# Patient Record
Sex: Female | Born: 1985 | Race: Black or African American | Hispanic: No | Marital: Married | Smoking: Former smoker
Health system: Southern US, Community
[De-identification: ages and names within clinical notes are randomized; demographics above are authoritative.]

## PROBLEM LIST (undated history)

## (undated) ENCOUNTER — Emergency Department (HOSPITAL_COMMUNITY): Discharge status: Absent without leave | Payer: Self-pay

## (undated) ENCOUNTER — Emergency Department (HOSPITAL_COMMUNITY): Discharge status: Absent without leave | Payer: No Typology Code available for payment source

## (undated) DIAGNOSIS — F329 Major depressive disorder, single episode, unspecified: Secondary | ICD-10-CM

## (undated) DIAGNOSIS — G43909 Migraine, unspecified, not intractable, without status migrainosus: Secondary | ICD-10-CM

## (undated) DIAGNOSIS — E559 Vitamin D deficiency, unspecified: Secondary | ICD-10-CM

## (undated) DIAGNOSIS — T7840XA Allergy, unspecified, initial encounter: Secondary | ICD-10-CM

## (undated) DIAGNOSIS — I10 Essential (primary) hypertension: Secondary | ICD-10-CM

## (undated) DIAGNOSIS — K115 Sialolithiasis: Secondary | ICD-10-CM

## (undated) DIAGNOSIS — F419 Anxiety disorder, unspecified: Secondary | ICD-10-CM

## (undated) DIAGNOSIS — F32A Depression, unspecified: Secondary | ICD-10-CM

## (undated) HISTORY — DX: Vitamin D deficiency, unspecified: E55.9

## (undated) HISTORY — DX: Migraine, unspecified, not intractable, without status migrainosus: G43.909

## (undated) HISTORY — PX: NO PAST SURGERIES: SHX2092

## (undated) HISTORY — DX: Essential (primary) hypertension: I10

## (undated) HISTORY — DX: Allergy, unspecified, initial encounter: T78.40XA

---

## 2006-05-26 ENCOUNTER — Ambulatory Visit (HOSPITAL_COMMUNITY): Admission: RE | Admit: 2006-05-26 | Discharge: 2006-05-26 | Payer: Self-pay | Admitting: Emergency Medicine

## 2006-10-03 ENCOUNTER — Emergency Department (HOSPITAL_COMMUNITY): Admission: EM | Admit: 2006-10-03 | Discharge: 2006-10-03 | Payer: Self-pay | Admitting: Emergency Medicine

## 2007-02-14 ENCOUNTER — Emergency Department (HOSPITAL_COMMUNITY): Admission: EM | Admit: 2007-02-14 | Discharge: 2007-02-14 | Payer: Self-pay | Admitting: Emergency Medicine

## 2007-03-24 ENCOUNTER — Encounter: Payer: Self-pay | Admitting: Obstetrics and Gynecology

## 2007-03-24 ENCOUNTER — Ambulatory Visit: Payer: Self-pay | Admitting: Obstetrics and Gynecology

## 2007-03-25 ENCOUNTER — Ambulatory Visit (HOSPITAL_COMMUNITY): Admission: RE | Admit: 2007-03-25 | Discharge: 2007-03-25 | Payer: Self-pay | Admitting: Family Medicine

## 2007-04-03 ENCOUNTER — Emergency Department (HOSPITAL_COMMUNITY): Admission: EM | Admit: 2007-04-03 | Discharge: 2007-04-03 | Payer: Self-pay | Admitting: Emergency Medicine

## 2008-03-15 ENCOUNTER — Emergency Department (HOSPITAL_COMMUNITY): Admission: EM | Admit: 2008-03-15 | Discharge: 2008-03-16 | Payer: Self-pay | Admitting: Emergency Medicine

## 2008-06-27 ENCOUNTER — Emergency Department (HOSPITAL_COMMUNITY): Admission: EM | Admit: 2008-06-27 | Discharge: 2008-06-27 | Payer: Self-pay | Admitting: Emergency Medicine

## 2008-08-30 IMAGING — US US SOFT TISSUE HEAD/NECK
1 series · 14 of 25 positions shown · non-contrast
Comparison: none

CLINICAL DATA: Thyroid nodules.  
 THYROID ULTRASOUND:
TECHNIQUE: Ultrasound examination of the thyroid gland and adjacent soft tissue structures was performed.

[Series 1: us soft tissue head/neck · 0.07mm/px · 14 of 38 slices shown]
[im 1/38]
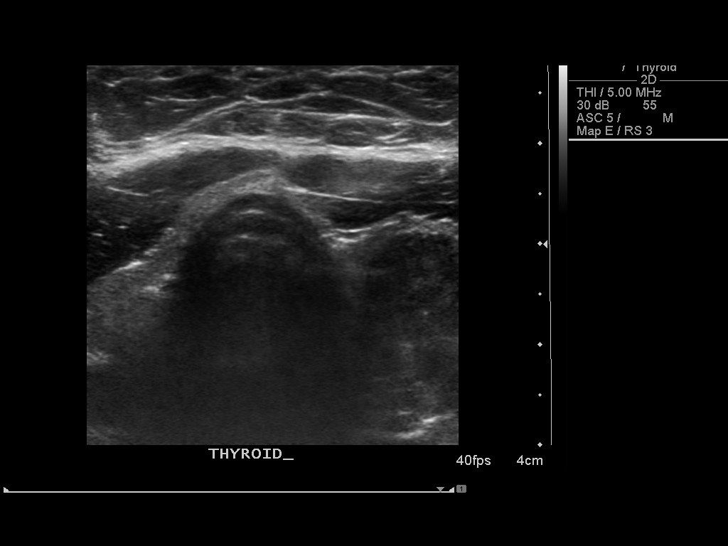
[im 4/38]
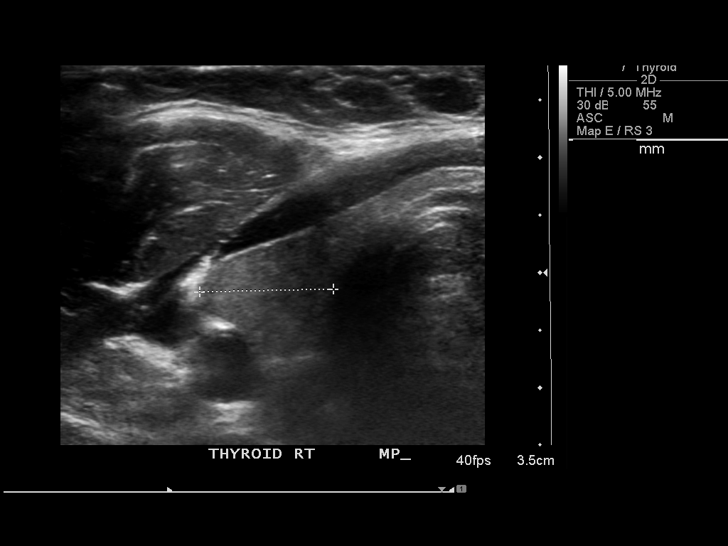
[im 7/38]
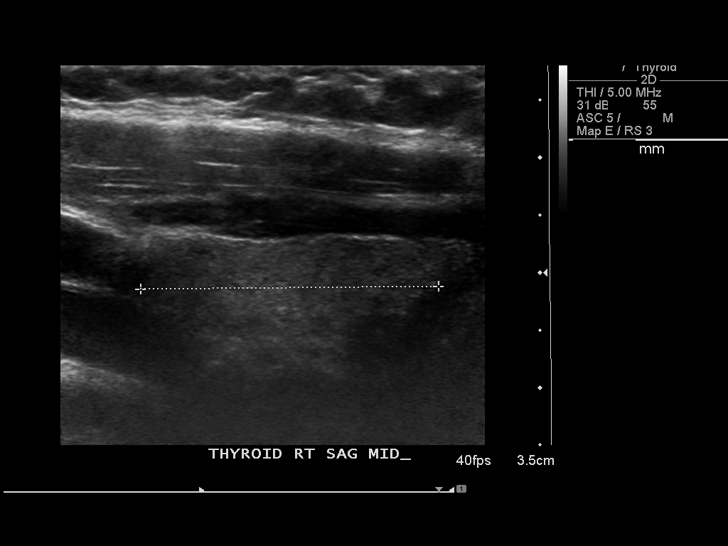
[im 10/38]
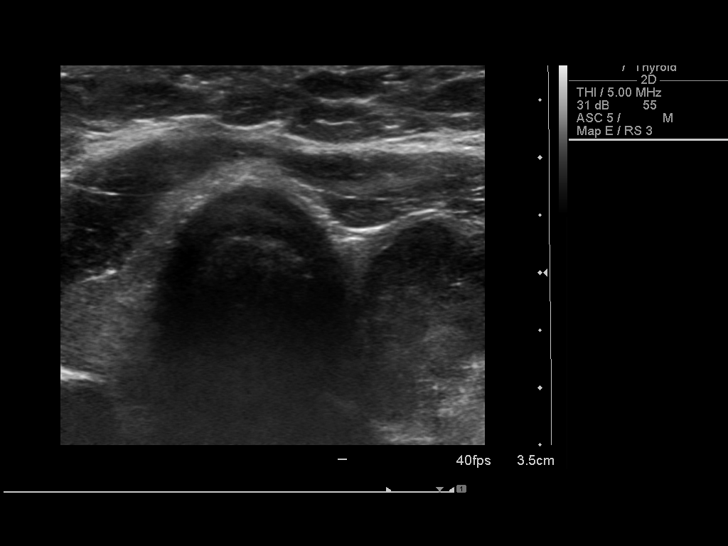
[im 13/38]
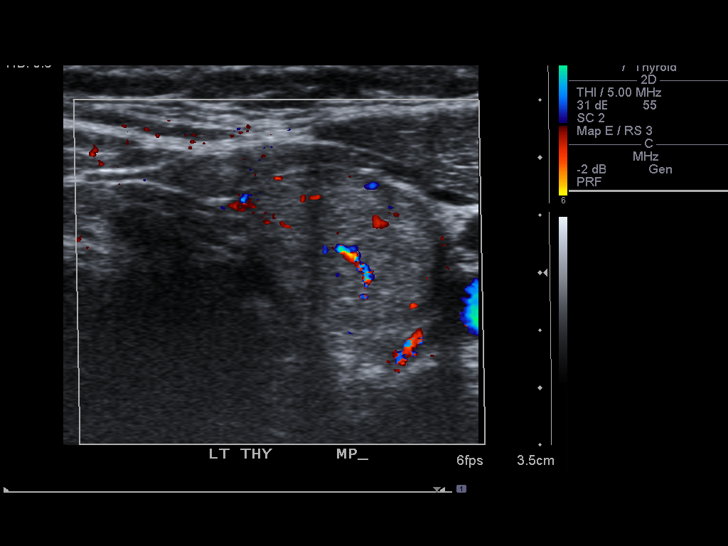
[im 14/38]
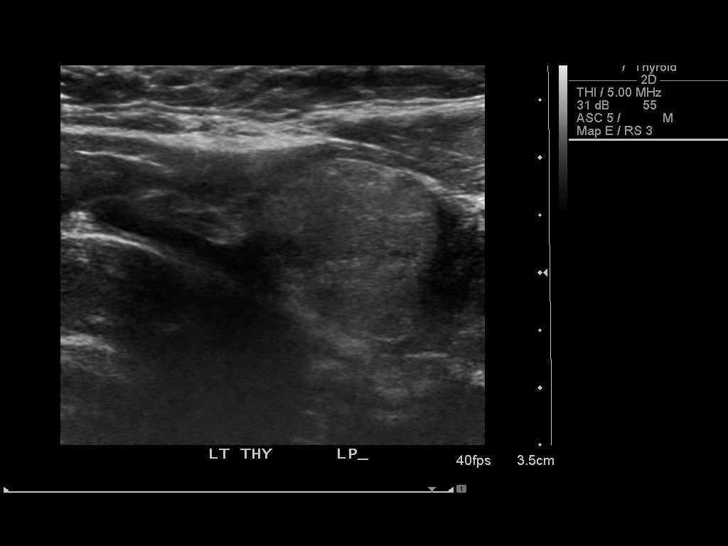
[im 17/38]
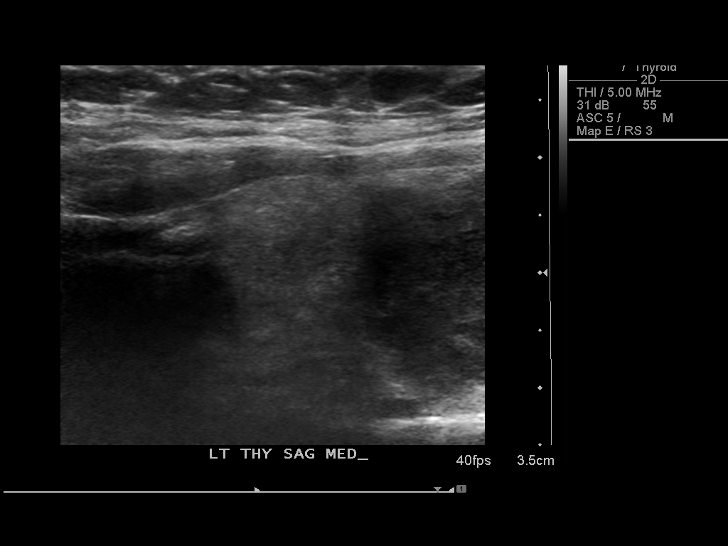
[im 21/38]
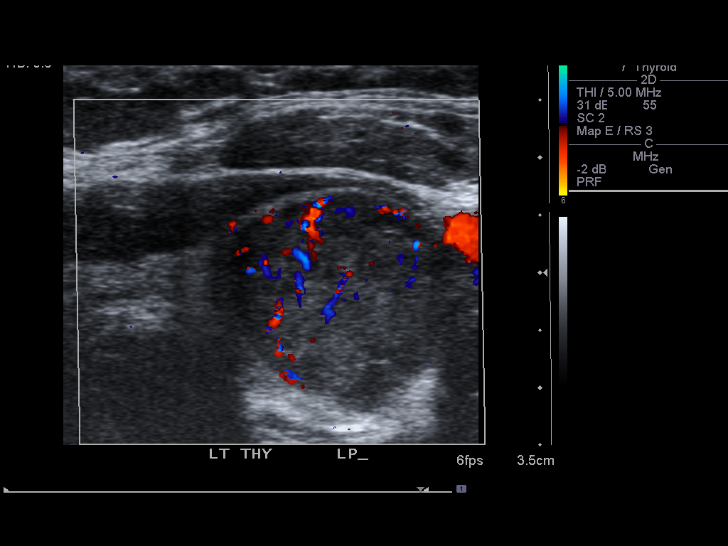
[im 24/38]
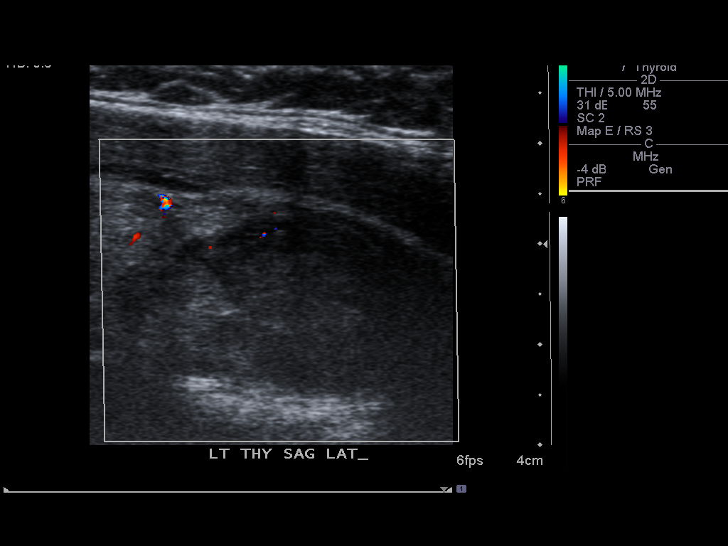
[im 25/38]
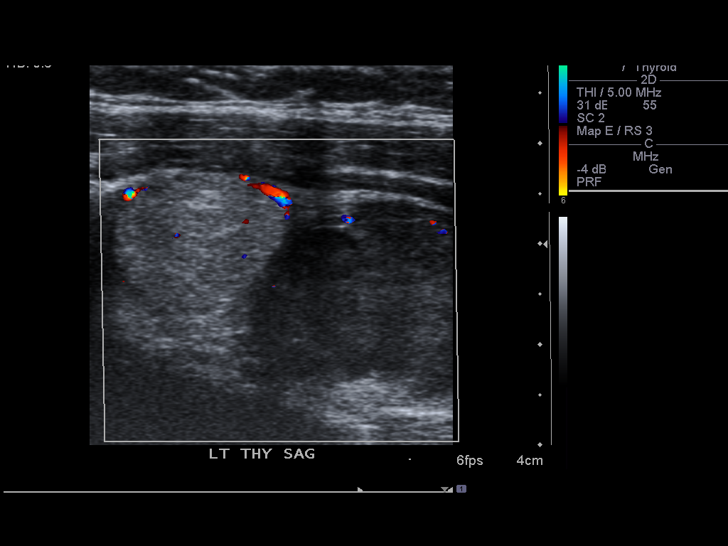
[im 28/38]
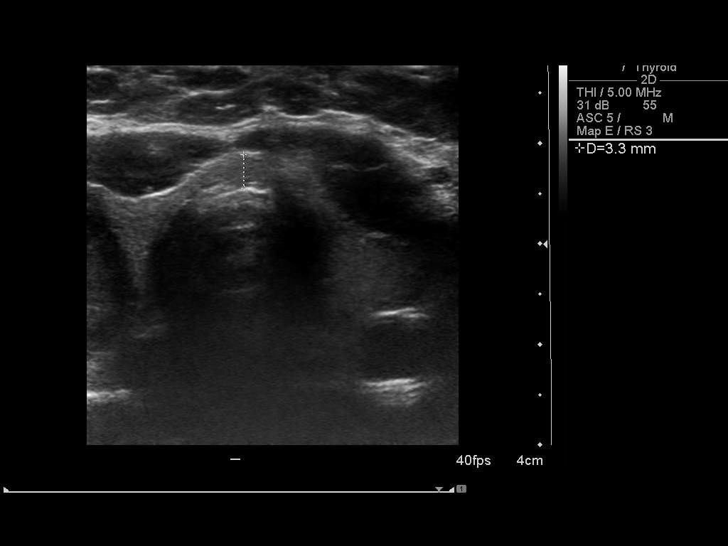
[im 31/38]
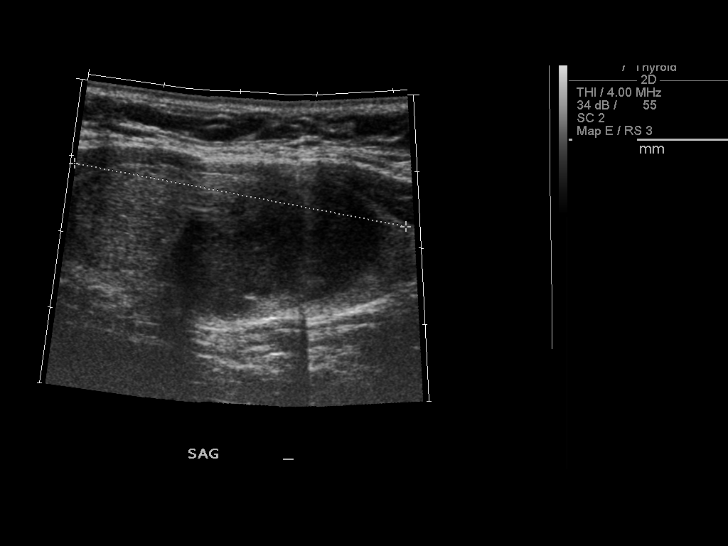
[im 34/38]
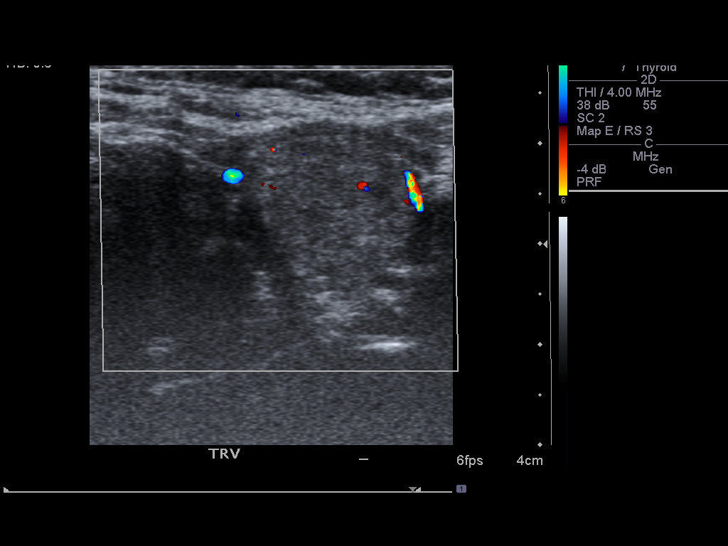
[im 38/38]
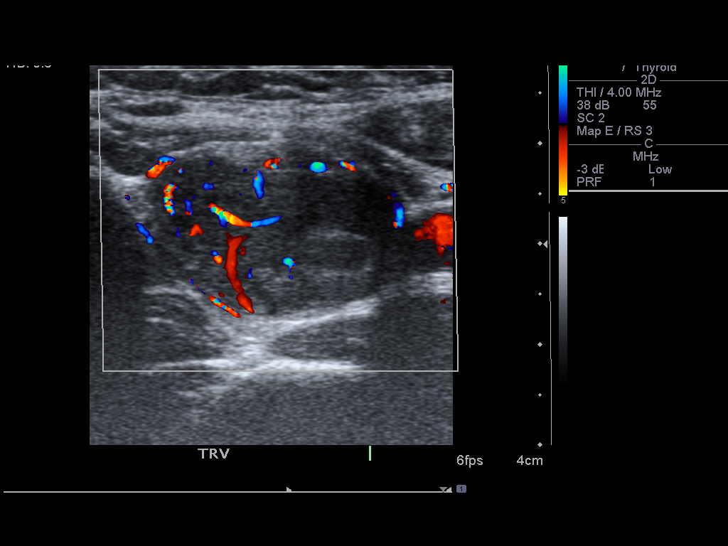

[14 of 25 positions shown; findings below may reference images not displayed]

FINDINGS: The right thyroid lobe is normal in size measuring approximately 2.6 cm in length x 1.0 cm in AP diameter x 1.2 cm in transverse diameter.  No thyroid nodules or masses are identified within the right lobe.  The thyroid isthmus is also normal in appearance.
 The left thyroid lobe is larger than the right measuring approximately 4 cm in length x 2 cm in AP diameter x 2.5 cm in transverse diameter.  A solid isoechoic nodule is seen within the upper left thyroid lobe which measures 1.7 x 1.3 x 1.7 cm.  A larger hypoechoic solid mass is seen in the lower left thyroid lobe which measures 3.0 x 1.8 x 2.3 cm.  No microcalcifications are noted.
IMPRESSION: 1.  Two solid nodules in the left thyroid lobe, largest in the lower pole measuring 3 cm.  Needle aspiration biopsy is recommended.
 2.  Normal right thyroid lobe.

## 2008-11-21 ENCOUNTER — Other Ambulatory Visit: Payer: Self-pay | Admitting: Emergency Medicine

## 2008-11-21 ENCOUNTER — Observation Stay (HOSPITAL_COMMUNITY): Admission: AD | Admit: 2008-11-21 | Discharge: 2008-11-21 | Payer: Self-pay | Admitting: Obstetrics and Gynecology

## 2008-12-03 ENCOUNTER — Encounter: Payer: Self-pay | Admitting: Physician Assistant

## 2008-12-03 ENCOUNTER — Ambulatory Visit: Payer: Self-pay | Admitting: Internal Medicine

## 2008-12-03 DIAGNOSIS — N949 Unspecified condition associated with female genital organs and menstrual cycle: Secondary | ICD-10-CM

## 2008-12-03 DIAGNOSIS — N938 Other specified abnormal uterine and vaginal bleeding: Secondary | ICD-10-CM

## 2008-12-03 DIAGNOSIS — Z8639 Personal history of other endocrine, nutritional and metabolic disease: Secondary | ICD-10-CM

## 2008-12-03 DIAGNOSIS — Z862 Personal history of diseases of the blood and blood-forming organs and certain disorders involving the immune mechanism: Secondary | ICD-10-CM

## 2008-12-03 DIAGNOSIS — E041 Nontoxic single thyroid nodule: Secondary | ICD-10-CM | POA: Insufficient documentation

## 2008-12-03 HISTORY — DX: Other specified abnormal uterine and vaginal bleeding: N93.8

## 2008-12-06 ENCOUNTER — Inpatient Hospital Stay (HOSPITAL_COMMUNITY): Admission: AD | Admit: 2008-12-06 | Discharge: 2008-12-06 | Payer: Self-pay | Admitting: Obstetrics & Gynecology

## 2008-12-11 ENCOUNTER — Encounter: Payer: Self-pay | Admitting: Physician Assistant

## 2008-12-11 LAB — CONVERTED CEMR LAB
ALT: <8 units/L (ref 0–35)
CO2: 20 meq/L (ref 19–32)
Calcium: 9.3 mg/dL (ref 8.4–10.5)
Chloride: 106 meq/L (ref 96–112)
Creatinine, Ser: 0.75 mg/dL (ref 0.40–1.20)
Eosinophils Absolute: 0.2 10*3/uL (ref 0.0–0.7)
HCT: 36.3 % (ref 36.0–46.0)
Lymphocytes Relative: 25 % (ref 12–46)
Lymphs Abs: 1.8 10*3/uL (ref 0.7–4.0)
MCHC: 27.3 g/dL — ABNORMAL LOW (ref 30.0–36.0)
Monocytes Relative: 6 % (ref 3–12)
Neutro Abs: 4.9 10*3/uL (ref 1.7–7.7)
Platelets: 458 10*3/uL — ABNORMAL HIGH (ref 150–400)
RDW: 28.1 % — ABNORMAL HIGH (ref 11.5–15.5)
Sodium: 141 meq/L (ref 135–145)
TSH: 2.079 microintl units/mL (ref 0.350–4.500)
Total Protein: 7.6 g/dL (ref 6.0–8.3)

## 2009-01-07 ENCOUNTER — Telehealth: Payer: Self-pay | Admitting: Physician Assistant

## 2009-02-22 ENCOUNTER — Telehealth: Payer: Self-pay | Admitting: Physician Assistant

## 2009-02-25 ENCOUNTER — Encounter (INDEPENDENT_AMBULATORY_CARE_PROVIDER_SITE_OTHER): Payer: Self-pay | Admitting: *Deleted

## 2009-03-11 ENCOUNTER — Emergency Department (HOSPITAL_COMMUNITY): Admission: EM | Admit: 2009-03-11 | Discharge: 2009-03-11 | Payer: Self-pay | Admitting: Emergency Medicine

## 2009-12-26 ENCOUNTER — Emergency Department (HOSPITAL_COMMUNITY): Admission: EM | Admit: 2009-12-26 | Discharge: 2009-12-26 | Payer: Self-pay | Admitting: Emergency Medicine

## 2010-02-10 ENCOUNTER — Emergency Department (HOSPITAL_COMMUNITY)
Admission: EM | Admit: 2010-02-10 | Discharge: 2010-02-10 | Payer: Self-pay | Source: Home / Self Care | Admitting: Emergency Medicine

## 2010-02-23 ENCOUNTER — Encounter: Payer: Self-pay | Admitting: Internal Medicine

## 2010-02-23 ENCOUNTER — Encounter: Payer: Self-pay | Admitting: *Deleted

## 2010-03-06 NOTE — Progress Notes (Signed)
  Phone Note Outgoing Call   Summary of Call: Pt still needs thyroid u/s. Initial call taken by: Brynda Rim,  February 22, 2009 2:26 PM  Follow-up for Phone Call        Left message on answering machine for pt to call back...pt needs appt time for ultrasound.Marland KitchenMarland KitchenMarland KitchenArmenia Shannon  February 22, 2009 3:10 PM   number is disconnected. ... willl mail letter..... [place] Shannon  February 25, 2009 12:07 PM

## 2010-03-06 NOTE — Letter (Signed)
Summary: *HSN Results Follow up  [facility]  [address] [place], [place] [address]   Phone: [phone]  Fax: [phone]      02/25/2009   Anne Shaffer [address] [place], [place]  [address]   Dear  Ms. Tashika MCCOLLUM,                            ____S.Drinkard,FNP   ____D. Gore,FNP       ____B. McPherson,MD   ____V. Rankins,MD    ____E. Mulberry,MD    ____N. Daphine Deutscher, FNP  ____D. Reche Dixon, MD    ____K. Philipp Deputy, MD    ____Other     This letter is to inform you that your recent test(s):  _______Pap Smear    _______Lab Test     _______X-ray    _______ is within acceptable limits  _______ requires a medication change  _______ requires a follow-up lab visit  _______ requires a follow-up visit with your provider   Comments:  We have been trying to reach you to let you know we rescheduled your appointment to recieve ultrasound.  Please give the office a call at your earliest convenience.       _________________________________________________________ If you have any questions, please contact our office                     Sincerely,  [place] Shannon [facility]

## 2010-03-06 NOTE — Letter (Signed)
Summary: TEST ORDER FORM//ULTRASOUND  TEST ORDER FORM//ULTRASOUND   Imported By: Arta Bruce 04/19/2009 12:22:47  _____________________________________________________________________  External Attachment:    Type:   Image     Comment:   External Document

## 2010-05-07 LAB — URINALYSIS, ROUTINE W REFLEX MICROSCOPIC
Bilirubin Urine: NEGATIVE
Specific Gravity, Urine: 1.01 (ref 1.005–1.030)
pH: 6 (ref 5.0–8.0)

## 2010-05-07 LAB — GC/CHLAMYDIA PROBE AMP, GENITAL

## 2010-05-07 LAB — CBC
MCHC: 29.9 g/dL — ABNORMAL LOW (ref 30.0–36.0)
Platelets: 503 10*3/uL — ABNORMAL HIGH (ref 150–400)
RDW: 31.8 % — ABNORMAL HIGH (ref 11.5–15.5)

## 2010-05-07 LAB — URINE MICROSCOPIC-ADD ON

## 2010-05-07 LAB — POCT PREGNANCY, URINE

## 2010-05-08 LAB — URINALYSIS, ROUTINE W REFLEX MICROSCOPIC
Bilirubin Urine: NEGATIVE
Nitrite: NEGATIVE
Protein, ur: NEGATIVE mg/dL
Specific Gravity, Urine: 1.017 (ref 1.005–1.030)
Urobilinogen, UA: 1 mg/dL (ref 0.0–1.0)

## 2010-05-08 LAB — CROSSMATCH
ABO/RH(D): A POS
Antibody Screen: NEGATIVE

## 2010-05-08 LAB — CBC
HCT: 22.5 % — ABNORMAL LOW (ref 36.0–46.0)
HCT: 24.5 % — ABNORMAL LOW (ref 36.0–46.0)
Hemoglobin: 6.4 g/dL — CL (ref 12.0–15.0)
Hemoglobin: 7.1 g/dL — CL (ref 12.0–15.0)
MCHC: 29 g/dL — ABNORMAL LOW (ref 30.0–36.0)
MCV: 58.9 fL — ABNORMAL LOW (ref 78.0–100.0)
MCV: 61.8 fL — ABNORMAL LOW (ref 78.0–100.0)
Platelets: 232 10*3/uL (ref 150–400)
RBC: 3.98 MIL/uL (ref 3.87–5.11)
WBC: 5.3 10*3/uL (ref 4.0–10.5)
WBC: 4.7 10*3/uL (ref 4.0–10.5)

## 2010-05-08 LAB — T3: T3, Total: 113.2 ng/dl (ref 80.0–204.0)

## 2010-05-08 LAB — POCT I-STAT, CHEM 8
BUN: 8 mg/dL (ref 6–23)
Chloride: 106 mEq/L (ref 96–112)
Creatinine, Ser: 0.4 mg/dL (ref 0.4–1.2)
Potassium: 3.6 mEq/L (ref 3.5–5.1)
Sodium: 141 mEq/L (ref 135–145)

## 2010-05-08 LAB — URINE MICROSCOPIC-ADD ON

## 2010-05-08 LAB — ABO/RH: ABO/RH(D): A POS

## 2010-05-08 LAB — PROTIME-INR: INR: 1.05 (ref 0.00–1.49)

## 2010-05-08 LAB — APTT: aPTT: 27 seconds (ref 24–37)

## 2010-05-13 LAB — URINALYSIS, ROUTINE W REFLEX MICROSCOPIC
Glucose, UA: NEGATIVE mg/dL
Hgb urine dipstick: NEGATIVE
Ketones, ur: NEGATIVE mg/dL
Protein, ur: NEGATIVE mg/dL
pH: 6 (ref 5.0–8.0)

## 2010-06-17 NOTE — Group Therapy Note (Signed)
NAME:  Anne Shaffer, Anne Shaffer NO.:  [id number]   MEDICAL RECORD NO.:  [id number]          PATIENT TYPE:  WOC   LOCATION:  [facility]                   FACILITY:  [facility]   PHYSICIAN:  Argentina Donovan, MD        DATE OF BIRTH:  28-Jul-1985   DATE OF SERVICE:  03/24/2007                                  CLINIC NOTE   The patient 25 year old African American female gravida 1, para 0-0-1-0  who had a spontaneous miscarriage sometime last summer and since that  time has had somewhat irregular periods.  She had apparently severe  throat infection back in the spring of last year and throat swollen  enough they sent her for a CT which showed apparently homogeneous  enlargement of the left lobe of the thyroid.  She has complained of some  fatigue and also said she is anemic.  Will be following that up.  On the  other hand she shows some sign of hirsutism with this a marked acne and  hair growth on her lower chin.  Subumbilical area does not seem to be  manifesting this problem.  And her main complaint here was to be  irregular periods with bleeding on and off sometimes heavy sometimes  light and this apparently has been going on since the early part of a  fall last year.  She was in to the MAU.  She said she ran a couple  pregnancy tests at home apparently and will one was positive, one was  negative, and she was never followed up again.  She has not had a Pap  smear in she does not know how long.   EXAMINATION:  External genitalia is normal.  BUS within normal limits.  Vagina is clean and well rugated.  Cervix is clean nulliparous.  Pap  smear was taken.  Uterus and adnexa were not palpated because of the  habitus of the patient 253 pounds and 5 feet 5 inches tall.  ABDOMEN:  Soft, flat and not particularly tender.   The plan is to follow up as was requested after the CT of her thyroid  with a non-emergent ultrasound.  We are going to comply with that as  well as get a pelvic  ultrasound to evaluate the uterus and ovaries and a  CBC and thyroid profile because of fatigue and the fact that she had an  enlarged gland on one side apparently with her CT scan.   IMPRESSION:  Amenorrhea.  Mild hirsutism.  Morbid obesity.  Probable  polycystic ovarian syndrome.  Will see her back in 2 weeks to review the  findings on and decide on therapy for this patient           ______________________________  Argentina Donovan, MD     PR/MEDQ  D:  03/24/2007  T:  03/25/2007  Job:  [id number]

## 2010-06-25 ENCOUNTER — Inpatient Hospital Stay (HOSPITAL_COMMUNITY)
Admission: AD | Admit: 2010-06-25 | Discharge: 2010-06-25 | Disposition: A | Discharge status: Home / Self Care | Payer: BC Managed Care – PPO | Source: Ambulatory Visit | Attending: Obstetrics & Gynecology | Admitting: Obstetrics & Gynecology

## 2010-06-25 DIAGNOSIS — F411 Generalized anxiety disorder: Secondary | ICD-10-CM | POA: Insufficient documentation

## 2010-06-25 DIAGNOSIS — R0602 Shortness of breath: Secondary | ICD-10-CM

## 2010-06-25 LAB — POCT PREGNANCY, URINE: Preg Test, Ur: NEGATIVE

## 2010-06-26 ENCOUNTER — Encounter: Payer: Self-pay | Admitting: *Deleted

## 2010-07-26 ENCOUNTER — Emergency Department (HOSPITAL_COMMUNITY)
Admission: EM | Admit: 2010-07-26 | Discharge: 2010-07-26 | Disposition: A | Discharge status: Home / Self Care | Payer: BC Managed Care – PPO | Attending: Emergency Medicine | Admitting: Emergency Medicine

## 2010-07-26 DIAGNOSIS — H9209 Otalgia, unspecified ear: Secondary | ICD-10-CM | POA: Insufficient documentation

## 2010-07-26 DIAGNOSIS — H6120 Impacted cerumen, unspecified ear: Secondary | ICD-10-CM | POA: Insufficient documentation

## 2010-11-14 LAB — CBC
Hemoglobin: 10.4 — ABNORMAL LOW
MCHC: 31.9
MCV: 76 — ABNORMAL LOW
RBC: 4.29
WBC: 7

## 2010-11-14 LAB — DIFFERENTIAL
Lymphs Abs: 1.4
Monocytes Absolute: 0.4
Monocytes Relative: 6
Neutro Abs: 5.1
Neutrophils Relative %: 73

## 2010-11-14 LAB — POCT PREGNANCY, URINE
Operator id: 151321
Preg Test, Ur: NEGATIVE

## 2010-12-30 ENCOUNTER — Emergency Department (HOSPITAL_COMMUNITY)
Admission: EM | Admit: 2010-12-30 | Discharge: 2010-12-30 | Disposition: A | Discharge status: Home Health | Payer: BC Managed Care – PPO

## 2010-12-30 NOTE — ED Notes (Signed)
Pt has decided to leave and and be been at a urgent care tomorrow morning. AMA papers signed

## 2011-03-16 ENCOUNTER — Encounter (HOSPITAL_COMMUNITY): Payer: Self-pay | Admitting: *Deleted

## 2011-03-16 ENCOUNTER — Inpatient Hospital Stay (HOSPITAL_COMMUNITY)
Admission: AD | Admit: 2011-03-16 | Discharge: 2011-03-17 | Disposition: A | Discharge status: Home / Self Care | Payer: BC Managed Care – PPO | Source: Ambulatory Visit | Attending: Obstetrics & Gynecology | Admitting: Obstetrics & Gynecology

## 2011-03-16 DIAGNOSIS — Z3202 Encounter for pregnancy test, result negative: Secondary | ICD-10-CM | POA: Insufficient documentation

## 2011-03-16 DIAGNOSIS — R112 Nausea with vomiting, unspecified: Secondary | ICD-10-CM | POA: Insufficient documentation

## 2011-03-16 HISTORY — DX: Anxiety disorder, unspecified: F41.9

## 2011-03-16 HISTORY — DX: Depression, unspecified: F32.A

## 2011-03-16 HISTORY — DX: Major depressive disorder, single episode, unspecified: F32.9

## 2011-03-16 LAB — URINALYSIS, ROUTINE W REFLEX MICROSCOPIC
Leukocytes, UA: NEGATIVE
Nitrite: NEGATIVE
Specific Gravity, Urine: <1.005 — ABNORMAL LOW (ref 1.005–1.030)
Urobilinogen, UA: 0.2 mg/dL (ref 0.0–1.0)
pH: 6.5 (ref 5.0–8.0)

## 2011-03-16 LAB — POCT PREGNANCY, URINE: Preg Test, Ur: NEGATIVE

## 2011-03-16 MED ORDER — PROMETHAZINE HCL 25 MG PO TABS: 25.0000 mg | ORAL_TABLET | Freq: Four times a day (QID) | ORAL | Status: AC | PRN

## 2011-03-16 NOTE — Progress Notes (Signed)
Pt c/o nasuea x3 weeks. Lower abdominal pain x1 week. No vaginal or discharge.

## 2011-03-16 NOTE — Discharge Instructions (Signed)
B.R.A.T. Diet    Your doctor has recommended the B.R.A.T. diet for you or your child until the condition improves. This is often used to help control diarrhea and vomiting symptoms. If you or your child can tolerate clear liquids, you may have:  · Bananas.   · Anne Shaffer.   · Applesauce.   · Toast (and other simple starches such as crackers, potatoes, noodles).   Be sure to avoid dairy products, meats, and fatty foods until symptoms are better. Fruit juices such as apple, grape, and prune juice can make diarrhea worse. Avoid these. Continue this diet for 2 days or as instructed by your caregiver.  Document Released: 01/19/2005 Document Revised: 10/01/2010 Document Reviewed: 07/08/2006  ExitCare® Patient Information ©2012 [employer].

## 2011-03-16 NOTE — ED Provider Notes (Signed)
History   Pt presents today c/o nausea with some vomiting over the past 3 wks. She states she mainly just wants a preg test. She denies abd pain and states she does not want a pelvic exam.   Chief Complaint  Patient presents with  . Nausea   HPI  OB History    Grav Para Term Preterm Abortions TAB SAB Ect Mult Living   1    1  1          Past Medical History  Diagnosis Date  . Anxiety   . Depression     Past Surgical History  Procedure Date  . No past surgeries     No family history on file.  History  Substance Use Topics  . Smoking status: Not on file  . Smokeless tobacco: Not on file  . Alcohol Use: No    Allergies:  Allergies  Allergen Reactions  . Shellfish Allergy     Prescriptions prior to admission  Medication Sig Dispense Refill  . aspirin-sod bicarb-citric acid (ALKA-SELTZER) 325 MG TBEF Take 325 mg by mouth as needed.      . fexofenadine (ALLEGRA) 30 MG tablet Take 30 mg by mouth as needed.        Review of Systems  Constitutional: Negative for fever and chills.  Eyes: Negative for blurred vision and double vision.  Respiratory: Negative for cough, hemoptysis, sputum production, shortness of breath and wheezing.   Cardiovascular: Negative for chest pain and palpitations.  Gastrointestinal: Positive for nausea and vomiting. Negative for abdominal pain, diarrhea and constipation.  Genitourinary: Negative for dysuria, urgency, frequency and hematuria.  Neurological: Negative for dizziness and headaches.  Psychiatric/Behavioral: Negative for depression and suicidal ideas.   Physical Exam   Blood pressure 115/91, pulse 100, temperature 98 F (36.7 C), temperature source Oral, resp. rate 20, height 5\' 6"  (1.676 m), weight 293 lb (132.904 kg), last menstrual period 02/17/2011.  Physical Exam  Nursing note and vitals reviewed. Constitutional: She is oriented to person, place, and time. She appears well-developed and well-nourished. No distress.  HENT:    Head: Normocephalic and atraumatic.  Eyes: EOM are normal. Pupils are equal, round, and reactive to light.  GI: Soft. She exhibits no distension. There is no tenderness. There is no rebound and no guarding.  Neurological: She is alert and oriented to person, place, and time.  Skin: Skin is warm and dry. She is not diaphoretic.  Psychiatric: She has a normal mood and affect. Her behavior is normal. Judgment and thought content normal.    MAU Course  Procedures  Results for orders placed during the hospital encounter of 03/16/11 (from the past 24 hour(s))  URINALYSIS, ROUTINE W REFLEX MICROSCOPIC     Status: Abnormal   Collection Time   03/16/11 10:00 PM      Component Value Range   Color, Urine STRAW (*) YELLOW    APPearance CLEAR  CLEAR    Specific Gravity, Urine <1.005 (*) 1.005 - 1.030    pH 6.5  5.0 - 8.0    Glucose, UA NEGATIVE  NEGATIVE (mg/dL)   Hgb urine dipstick NEGATIVE  NEGATIVE    Bilirubin Urine NEGATIVE  NEGATIVE    Ketones, ur NEGATIVE  NEGATIVE (mg/dL)   Protein, ur NEGATIVE  NEGATIVE (mg/dL)   Urobilinogen, UA 0.2  0.0 - 1.0 (mg/dL)   Nitrite NEGATIVE  NEGATIVE    Leukocytes, UA NEGATIVE  NEGATIVE   POCT PREGNANCY, URINE     Status: Normal  Collection Time   03/16/11 10:12 PM      Component Value Range   Preg Test, Ur NEGATIVE  NEGATIVE      Assessment and Plan  N&V: discussed with pt at length. Will give Rx for phenergan. She will f/u with her PCP. Discussed diet, activity, risks, and precautions.  Clinton Gallant. Avacyn Kloosterman III, DrHSc, MPAS, PA-C  03/16/2011, 11:46 PM   Henrietta Hoover, PA 03/16/11 2349

## 2012-06-03 ENCOUNTER — Encounter (HOSPITAL_COMMUNITY): Payer: Self-pay | Admitting: Emergency Medicine

## 2012-06-03 ENCOUNTER — Emergency Department (HOSPITAL_COMMUNITY)
Admission: EM | Admit: 2012-06-03 | Discharge: 2012-06-03 | Disposition: A | Discharge status: Home / Self Care | Payer: BC Managed Care – PPO | Attending: Emergency Medicine | Admitting: Emergency Medicine

## 2012-06-03 DIAGNOSIS — R220 Localized swelling, mass and lump, head: Secondary | ICD-10-CM

## 2012-06-03 DIAGNOSIS — K029 Dental caries, unspecified: Secondary | ICD-10-CM | POA: Insufficient documentation

## 2012-06-03 DIAGNOSIS — Z8659 Personal history of other mental and behavioral disorders: Secondary | ICD-10-CM | POA: Insufficient documentation

## 2012-06-03 MED ORDER — PENICILLIN V POTASSIUM 500 MG PO TABS: 500.0000 mg | ORAL_TABLET | Freq: Four times a day (QID) | ORAL | Status: AC

## 2012-06-03 MED ORDER — OXYCODONE-ACETAMINOPHEN 5-325 MG PO TABS
2.0000 | ORAL_TABLET | Freq: Once | ORAL | Status: AC
  Administered 2012-06-03: 2 via ORAL
  Filled 2012-06-03: qty 2

## 2012-06-03 MED ORDER — OXYCODONE-ACETAMINOPHEN 5-325 MG PO TABS: 1.0000 | ORAL_TABLET | Freq: Four times a day (QID) | ORAL | Status: DC | PRN

## 2012-06-03 NOTE — ED Provider Notes (Signed)
History     CSN: [id number]  Arrival date & time 06/03/12  0102   None     Chief Complaint  Patient presents with  . Jaw Pain    (Consider location/radiation/quality/duration/timing/severity/associated sxs/prior treatment) HPI Comments: Patient is a 27 year old female who presents for left-sided facial swelling x2 days. Patient states the swelling is accompanied by a throbbing pain sensation that is waxing and waning in severity, intermittent, and nonradiating. Patient denies any aggravating factors however states that [facility] powder alleviates her discomfort. Patient denies fevers, ear pain or discharge, eye pain, drooling, sore throat or difficulty swallowing, cough, and shortness of breath.   The history is provided by the patient. No language interpreter was used.    Past Medical History  Diagnosis Date  . Anxiety   . Depression     Past Surgical History  Procedure Laterality Date  . No past surgeries      No family history on file.  History  Substance Use Topics  . Smoking status: Not on file  . Smokeless tobacco: Not on file  . Alcohol Use: No    OB History   Grav Para Term Preterm Abortions TAB SAB Ect Mult Living   1    1  1          Review of Systems  Constitutional: Negative for fever.  HENT: Positive for facial swelling and dental problem.   Skin: Negative for color change.  Neurological: Negative for numbness.  All other systems reviewed and are negative.    Allergies  Shellfish allergy  Home Medications   Current Outpatient Rx  Name  Route  Sig  Dispense  Refill  . oxyCODONE-acetaminophen (PERCOCET/ROXICET) 5-325 MG per tablet   Oral   Take 1 tablet by mouth every 6 (six) hours as needed for pain.   6 tablet   0   . penicillin v potassium (VEETID) 500 MG tablet   Oral   Take 1 tablet (500 mg total) by mouth 4 (four) times daily.   40 tablet   0     BP 139/94  Pulse 100  Temp(Src) 97.9 F (36.6 C) (Oral)  Resp 14  SpO2 98%  LMP  05/02/2012  Physical Exam  Nursing note and vitals reviewed. Constitutional: She is oriented to person, place, and time. She appears well-developed and well-nourished. No distress.  HENT:  Head: Normocephalic and atraumatic. No trismus in the jaw.  Right Ear: Tympanic membrane, external ear and ear canal normal. No mastoid tenderness.  Left Ear: Tympanic membrane, external ear and ear canal normal. No mastoid tenderness.  Nose: Nose normal.  Mouth/Throat: Uvula is midline and mucous membranes are normal. No oral lesions. Abnormal dentition. Dental caries present. No edematous. No oropharyngeal exudate, posterior oropharyngeal edema, posterior oropharyngeal erythema or tonsillar abscesses.  + L lower gingival swelling without fluctuance  Eyes: Conjunctivae and EOM are normal. Pupils are equal, round, and reactive to light. Right eye exhibits no discharge. Left eye exhibits no discharge. No scleral icterus.  Neck: Normal range of motion. Neck supple.  Cardiovascular: Normal rate, regular rhythm and intact distal pulses.   Pulmonary/Chest: Effort normal. No respiratory distress.  Lymphadenopathy:    She has no cervical adenopathy.  Neurological: She is alert and oriented to person, place, and time.  Skin: Skin is warm and dry. No rash noted. She is not diaphoretic. No erythema. No pallor.  Psychiatric: She has a normal mood and affect. Her behavior is normal.    ED Course  Procedures (including critical care time)  Labs Reviewed - No data to display No results found.   1. Left facial swelling      MDM  L facial swelling with dental caries and L lower gingival swelling appreciated on physical exam. Patient denies fever, sore throat, dysphagia, inability to swallow secretions, and SOB. Patient well and nontoxic appearing without trismus, mastoid tenderness, or TTP of inferior angle of jaw. Ddx - dental pain vs early dental abscess vs sialolithiasis. Endorses improvement in pain with  percocet given in ED. Patient hemodynamically stable and appropriate for d/c with dental f/u. Percocet given for pain and Penicillin VK given to cover for early abscess. Indications for ED return discussed. Patient states comfort and understanding with this d/c plan with no unaddressed concerns.         Antony Madura, [place] 06/06/12 [phone]

## 2012-06-03 NOTE — ED Notes (Signed)
Pt discharged.Vital signs stable and GCS 15 

## 2012-06-03 NOTE — ED Notes (Signed)
PT. REPORTS LEFT JAW/LEFT FACIAL SWELLING WITH PAIN , DENIES INJURY.

## 2012-06-06 NOTE — ED Provider Notes (Signed)
Medical screening examination/treatment/procedure(s) were performed by non-physician practitioner and as supervising physician I was immediately available for consultation/collaboration.   Solana Coggin L Shavone Nevers, MD 06/06/12 1919 

## 2012-10-26 ENCOUNTER — Ambulatory Visit: Payer: BC Managed Care – PPO | Admitting: Internal Medicine

## 2012-12-02 ENCOUNTER — Ambulatory Visit (INDEPENDENT_AMBULATORY_CARE_PROVIDER_SITE_OTHER): Payer: BC Managed Care – PPO | Admitting: Internal Medicine

## 2012-12-02 ENCOUNTER — Encounter: Payer: Self-pay | Admitting: Internal Medicine

## 2012-12-02 ENCOUNTER — Other Ambulatory Visit (INDEPENDENT_AMBULATORY_CARE_PROVIDER_SITE_OTHER): Payer: BC Managed Care – PPO

## 2012-12-02 VITALS — BP 120/84 | HR 72 | Temp 98.0°F | Ht 65.0 in | Wt 291.0 lb

## 2012-12-02 DIAGNOSIS — Z0000 Encounter for general adult medical examination without abnormal findings: Secondary | ICD-10-CM

## 2012-12-02 DIAGNOSIS — R05 Cough: Secondary | ICD-10-CM

## 2012-12-02 DIAGNOSIS — R059 Cough, unspecified: Secondary | ICD-10-CM

## 2012-12-02 DIAGNOSIS — Z1329 Encounter for screening for other suspected endocrine disorder: Secondary | ICD-10-CM

## 2012-12-02 DIAGNOSIS — Z130 Encounter for screening for diseases of the blood and blood-forming organs and certain disorders involving the immune mechanism: Secondary | ICD-10-CM

## 2012-12-02 DIAGNOSIS — Z13220 Encounter for screening for lipoid disorders: Secondary | ICD-10-CM

## 2012-12-02 DIAGNOSIS — Z131 Encounter for screening for diabetes mellitus: Secondary | ICD-10-CM

## 2012-12-02 DIAGNOSIS — N926 Irregular menstruation, unspecified: Secondary | ICD-10-CM

## 2012-12-02 HISTORY — DX: Morbid (severe) obesity due to excess calories: E66.01

## 2012-12-02 LAB — COMPREHENSIVE METABOLIC PANEL
ALT: 19 U/L (ref 0–35)
AST: 17 U/L (ref 0–37)
Calcium: 9.1 mg/dL (ref 8.4–10.5)
Chloride: 104 mEq/L (ref 96–112)
Creatinine, Ser: 0.7 mg/dL (ref 0.4–1.2)
Potassium: 4.1 mEq/L (ref 3.5–5.1)
Sodium: 137 mEq/L (ref 135–145)
Total Protein: 7.6 g/dL (ref 6.0–8.3)

## 2012-12-02 LAB — CBC
MCHC: 31.1 g/dL (ref 30.0–36.0)
RDW: 18.5 % — ABNORMAL HIGH (ref 11.5–14.6)
WBC: 7.6 10*3/uL (ref 4.5–10.5)

## 2012-12-02 LAB — LIPID PANEL
HDL: 30.7 mg/dL — ABNORMAL LOW (ref >39.00)
Total CHOL/HDL Ratio: 4

## 2012-12-02 LAB — TSH: TSH: 2.43 u[IU]/mL (ref 0.35–5.50)

## 2012-12-02 LAB — HEMOGLOBIN A1C: Hgb A1c MFr Bld: 5.7 % (ref 4.6–6.5)

## 2012-12-02 LAB — POCT URINE PREGNANCY: Preg Test, Ur: NEGATIVE

## 2012-12-02 MED ORDER — HYDROCODONE-HOMATROPINE 5-1.5 MG/5ML PO SYRP: 5.0000 mL | ORAL_SOLUTION | Freq: Three times a day (TID) | ORAL | Status: DC | PRN

## 2012-12-02 NOTE — Assessment & Plan Note (Signed)
Pt noncompliant with diet and exercise Has no interest in lifestyle changes at this current time Discussed the risk of obesity including DM2, HTN, heart attack, etc. Pt verbally understands.

## 2012-12-02 NOTE — Progress Notes (Signed)
HPI  Pt presents to the clinic today to establish care. She is not transferring care from any other providers. She does have some concerns today. 1- She has had a cough for about 2 weeks. She does not produce any mucous. She denies fever, chills or body aches. She has taken Mucinex, Nyquil and an allergy pill. It only seems to bother her at night. She is having trouble sleeping secondary to the cough. Also, she c/o  occasionally swelling in her right foot. She does stand at work all day. She does not notice any swelling in the morning. The swelling is not painful.  Flu: never Tetanus: within the last 10 years LMP: 11/01/12 Pap Smear: 2014 Dentist: as needed  Past Medical History  Diagnosis Date  . Anxiety   . Depression     Current Outpatient Prescriptions  Medication Sig Dispense Refill  . oxyCODONE-acetaminophen (PERCOCET/ROXICET) 5-325 MG per tablet Take 1 tablet by mouth every 6 (six) hours as needed for pain.  6 tablet  0   No current facility-administered medications for this visit.    Allergies  Allergen Reactions  . Shellfish Allergy     Family History  Problem Relation Age of Onset  . Hyperlipidemia Maternal Grandmother   . Diabetes Maternal Grandmother   . Cancer Maternal Grandfather     prostate  . Hyperlipidemia Maternal Grandfather   . Early death Neg Hx   . Stroke Neg Hx     History   Social History  . Marital Status: Single    Spouse Name: N/A    Number of Children: N/A  . Years of Education: N/A   Occupational History  . Not on file.   Social History Main Topics  . Smoking status: Never Smoker   . Smokeless tobacco: Not on file  . Alcohol Use: No  . Drug Use: No  . Sexual Activity: Yes    Birth Control/ Protection: None   Other Topics Concern  . Not on file   Social History Narrative  . No narrative on file    ROS:  Constitutional: Denies fever, malaise, fatigue, headache or abrupt weight changes.  HEENT: Denies eye pain, eye  redness, ear pain, ringing in the ears, wax buildup, runny nose, nasal congestion, bloody nose, or sore throat. Respiratory: Denies difficulty breathing, shortness of breath or sputum production.   Cardiovascular: Denies chest pain, chest tightness, palpitations or swelling in the hands.  Gastrointestinal: Denies abdominal pain, bloating, constipation, diarrhea or blood in the stool.  GU: Denies frequency, urgency, pain with urination, blood in urine, odor or discharge. Musculoskeletal: Denies decrease in range of motion, difficulty with gait, muscle pain or joint pain and swelling.  Skin: Denies redness, rashes, lesions or ulcercations.  Neurological: Denies dizziness, difficulty with memory, difficulty with speech or problems with balance and coordination.   No other specific complaints in a complete review of systems (except as listed in HPI above).  PE:  BP 120/84  Pulse 72  Temp(Src) 98 F (36.7 C) (Oral)  Ht 5\' 5"  (1.651 m)  Wt 291 lb (131.997 kg)  BMI 48.43 kg/m2  SpO2 98% Wt Readings from Last 3 Encounters:  12/02/12 291 lb (131.997 kg)  03/16/11 293 lb (132.904 kg)  12/03/08 248 lb (112.492 kg)    General: Appears her stated age, obese but  well developed, well nourished in NAD. HEENT: Head: normal shape and size; Eyes: sclera white, no icterus, conjunctiva pink, PERRLA and EOMs intact; Ears: Tm's gray and intact, normal  light reflex; Nose: mucosa pink and moist, septum midline; Throat/Mouth: Teeth present, mucosa pink and moist, no lesions or ulcerations noted.  Neck: Normal range of motion. Neck supple, trachea midline. No massses, lumps or thyromegaly present.  Cardiovascular: Normal rate and rhythm. S1,S2 noted.  No murmur, rubs or gallops noted. No JVD or BLE edema. No carotid bruits noted. Pulmonary/Chest: Normal effort and positive vesicular breath sounds. No respiratory distress. No wheezes, rales or ronchi noted.  Abdomen: Soft and nontender. Normal bowel sounds, no  bruits noted. No distention or masses noted. Liver, spleen and kidneys non palpable. Musculoskeletal: Normal range of motion. No signs of joint swelling. No difficulty with gait.  Neurological: Alert and oriented. Cranial nerves II-XII intact. Coordination normal. +DTRs bilaterally. Psychiatric: Mood and affect normal. Behavior is normal. Judgment and thought content normal.     BMET    Component Value Date/Time   NA 141 12/03/2008 2141   K 4.9 12/03/2008 2141   CL 106 12/03/2008 2141   CO2 20 12/03/2008 2141   GLUCOSE 73 12/03/2008 2141   BUN 6 12/03/2008 2141   CREATININE 0.75 12/03/2008 2141   CALCIUM 9.3 12/03/2008 2141    Lipid Panel  No results found for this basename: chol, trig, hdl, cholhdl, vldl, ldlcalc    CBC    Component Value Date/Time   WBC 8.1 12/06/2008 1308   RBC 4.97 12/06/2008 1308   HGB 10.2* 12/06/2008 1308   HCT 34.1* 12/06/2008 1308   PLT 503* 12/06/2008 1308   MCV 68.6* 12/06/2008 1308   MCHC 29.9* 12/06/2008 1308   RDW 31.8* 12/06/2008 1308   LYMPHSABS 1.8 12/03/2008 2141   MONOABS 0.4 12/03/2008 2141   EOSABS 0.2 12/03/2008 2141   BASOSABS 0.0 12/03/2008 2141    Hgb A1C No results found for this basename: HGBA1C     Assessment and Plan:  Preventative Health Maintenance:  Pt declines flu today Will obtain screening blood work today Encouraged pt to visit dentist regulary Will check urine Hcg to r/o pregnancy secondary to irregular menses- she would not like to start BCP Will give Hycodan for cough- let me know if the cough becomes productive or if you start having fevers, chills or body aches  RTC in 1 year or sooner if needed

## 2012-12-02 NOTE — Patient Instructions (Signed)

## 2013-01-07 ENCOUNTER — Encounter (HOSPITAL_COMMUNITY): Payer: Self-pay | Admitting: Emergency Medicine

## 2013-01-07 ENCOUNTER — Emergency Department (HOSPITAL_COMMUNITY)
Admission: EM | Admit: 2013-01-07 | Discharge: 2013-01-07 | Disposition: A | Discharge status: Home / Self Care | Payer: BC Managed Care – PPO | Attending: Emergency Medicine | Admitting: Emergency Medicine

## 2013-01-07 DIAGNOSIS — Z8679 Personal history of other diseases of the circulatory system: Secondary | ICD-10-CM | POA: Insufficient documentation

## 2013-01-07 DIAGNOSIS — R05 Cough: Secondary | ICD-10-CM

## 2013-01-07 DIAGNOSIS — Z8659 Personal history of other mental and behavioral disorders: Secondary | ICD-10-CM | POA: Insufficient documentation

## 2013-01-07 DIAGNOSIS — J069 Acute upper respiratory infection, unspecified: Secondary | ICD-10-CM

## 2013-01-07 DIAGNOSIS — Z87891 Personal history of nicotine dependence: Secondary | ICD-10-CM | POA: Insufficient documentation

## 2013-01-07 MED ORDER — HYDROCODONE-HOMATROPINE 5-1.5 MG/5ML PO SYRP: 5.0000 mL | ORAL_SOLUTION | Freq: Three times a day (TID) | ORAL | Status: DC | PRN

## 2013-01-07 NOTE — ED Notes (Signed)
Pt from home reports productive cough with green sputum and nasal congestion x1 week. Pt denies N/V/D, urinary issues. Pt is A&O and in NAD

## 2013-01-07 NOTE — ED Provider Notes (Signed)
CSN: [id number]     Arrival date & time 01/07/13  0911 History   First MD Initiated Contact with Patient 01/07/13 0935     Chief Complaint  Patient presents with  . Cough  . Nasal Congestion   (Consider location/radiation/quality/duration/timing/severity/associated sxs/prior Treatment) HPI  27 year old female presents with URI symptoms. Patient reports for the past week she has had nasal congestion, runny nose, cough productive with green sputum. Cough is keeping her up at night. Otherwise she denies fever, headache, ear pain, sneezing, sore throat, neck pain, shortness of breath, hemoptysis, nausea, vomiting diarrhea, rash. She has tried taking over-the-counter medication with some relief. 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 She has no other complaints.  Past Medical History  Diagnosis Date  . Anxiety   . Depression   . Migraines    Past Surgical History  Procedure Laterality Date  . No past surgeries     Family History  Problem Relation Age of Onset  . Hyperlipidemia Maternal Grandmother   . Diabetes Maternal Grandmother   . Cancer Maternal Grandfather     prostate  . Hyperlipidemia Maternal Grandfather   . Early death Neg Hx   . Stroke Neg Hx    History  Substance Use Topics  . Smoking status: Former Games developer  . Smokeless tobacco: Not on file  . Alcohol Use: No   OB History   Grav Para Term Preterm Abortions TAB SAB Ect Mult Living   1    1  1         Review of Systems  Constitutional: Negative for fever.  HENT: Positive for postnasal drip and rhinorrhea. Negative for sore throat.   Respiratory: Positive for cough.   Skin: Negative for rash.    Allergies  Shellfish allergy  Home Medications   Current Outpatient Rx  Name  Route  Sig  Dispense  Refill  . HYDROcodone-homatropine (HYCODAN) 5-1.5 MG/5ML syrup   Oral   Take 5 mLs by mouth every 8 (eight) hours as needed for cough.   120 mL   0    BP 126/83  Pulse 93  Temp(Src) 98.3 F  (36.8 C) (Oral)  Resp 20  SpO2 100%  LMP 12/17/2012 Physical Exam  Nursing note and vitals reviewed. Constitutional: She appears well-developed and well-nourished. No distress.  HENT:  Head: Atraumatic.  Right Ear: External ear normal.  Left Ear: External ear normal.  Mouth/Throat: Oropharynx is clear and moist.  Eyes: Conjunctivae are normal.  Neck: Neck supple.  Cardiovascular: Normal rate and regular rhythm.   Pulmonary/Chest: Effort normal and breath sounds normal. No respiratory distress. She has no wheezes.  Abdominal: There is no tenderness.  Neurological: She is alert.  Skin: No rash noted.  Psychiatric: She has a normal mood and affect.    ED Course  Procedures (including critical care time)  9:46 AM Patient here with URI symptoms. She sats at 100% on room air, is afebrile, her lungs clear to auscultation. Low suspicion for pneumonia. We'll prescribe Hycodan. Return precaution discussed.    Labs Review Labs Reviewed - No data to display Imaging Review No results found.  EKG Interpretation   None       MDM   1. URI (upper respiratory infection)   2. Cough    BP 126/83  Pulse 93  Temp(Src) 98.3 F (36.8 C) (Oral)  Resp 20  SpO2 100%  LMP 12/17/2012     Fayrene Helper, PA-C 01/07/13 [phone]

## 2013-01-07 NOTE — ED Notes (Signed)
She tells me she has had u.r.i. Symptoms since this Mon.  She is in no distress.  She denies abd. Pain/n/v/d--c/o cough and congestion.  She has been seen by our provider, and is happy to be going home.

## 2013-01-09 NOTE — ED Provider Notes (Signed)
Medical screening examination/treatment/procedure(s) were performed by non-physician practitioner and as supervising physician I was immediately available for consultation/collaboration.  EKG Interpretation   None        Derwood Kaplan, MD 01/09/13 [phone]

## 2013-12-04 ENCOUNTER — Encounter (HOSPITAL_COMMUNITY): Payer: Self-pay | Admitting: Emergency Medicine

## 2014-09-11 ENCOUNTER — Encounter (HOSPITAL_COMMUNITY): Payer: Self-pay | Admitting: *Deleted

## 2014-09-11 ENCOUNTER — Emergency Department (HOSPITAL_COMMUNITY)
Admission: EM | Admit: 2014-09-11 | Discharge: 2014-09-11 | Disposition: A | Discharge status: Home / Self Care | Payer: Self-pay | Attending: Emergency Medicine | Admitting: Emergency Medicine

## 2014-09-11 DIAGNOSIS — R600 Localized edema: Secondary | ICD-10-CM | POA: Insufficient documentation

## 2014-09-11 DIAGNOSIS — Z87891 Personal history of nicotine dependence: Secondary | ICD-10-CM | POA: Insufficient documentation

## 2014-09-11 DIAGNOSIS — R220 Localized swelling, mass and lump, head: Secondary | ICD-10-CM

## 2014-09-11 DIAGNOSIS — K115 Sialolithiasis: Secondary | ICD-10-CM | POA: Insufficient documentation

## 2014-09-11 DIAGNOSIS — Z8679 Personal history of other diseases of the circulatory system: Secondary | ICD-10-CM | POA: Insufficient documentation

## 2014-09-11 DIAGNOSIS — Z8659 Personal history of other mental and behavioral disorders: Secondary | ICD-10-CM | POA: Insufficient documentation

## 2014-09-11 HISTORY — DX: Sialolithiasis: K11.5

## 2014-09-11 MED ORDER — ACETAMINOPHEN-CODEINE #3 300-30 MG PO TABS: 1.0000 | ORAL_TABLET | Freq: Four times a day (QID) | ORAL | Status: DC | PRN

## 2014-09-11 NOTE — ED Provider Notes (Signed)
CSN: [id number]     Arrival date & time 09/11/14  0910 History   First MD Initiated Contact with Patient 09/11/14 [phone]     Chief Complaint  Patient presents with  . Facial Swelling     (Consider location/radiation/quality/duration/timing/severity/associated sxs/prior Treatment) HPI Anne Shaffer is a 29 y.o. female with a history of salivary stone comes in for possible recurrence. Patient states for the past week she has noticed increased swelling on the right side of her cheek. She denies any dental pain, difficulty swallowing, jaw pain, difficulties breathing. She reports she has a dentist appointment in the next 2 weeks for ongoing dental issues. She reports having a salivary stone in the past and this feels the same. She has tried hot and cold compresses, ibuprofen as well as lemon drops without complete relief. She reports the lemon drops will help initially, but discomfort will return later on. She reports her swelling in her cheek has been gradually resolving. No other aggravating or modifying factors.  Past Medical History  Diagnosis Date  . Anxiety   . Depression   . Migraines   . Salivary calculus    Past Surgical History  Procedure Laterality Date  . No past surgeries     Family History  Problem Relation Age of Onset  . Hyperlipidemia Maternal Grandmother   . Diabetes Maternal Grandmother   . Cancer Maternal Grandfather     prostate  . Hyperlipidemia Maternal Grandfather   . Early death Neg Hx   . Stroke Neg Hx    History  Substance Use Topics  . Smoking status: Former Games developer  . Smokeless tobacco: Not on file  . Alcohol Use: No   OB History    Gravida Para Term Preterm AB TAB SAB Ectopic Multiple Living   1    1  1         Review of Systems A 10 point review of systems was completed and was negative except for pertinent positives and negatives as mentioned in the history of present illness     Allergies  Shellfish allergy  Home Medications   Prior  to Admission medications   Medication Sig Start Date End Date Taking? Authorizing Provider  acetaminophen-codeine (TYLENOL #3) 300-30 MG per tablet Take 1-2 tablets by mouth every 6 (six) hours as needed for moderate pain. 09/11/14   Joycie Peek, PA-C  HYDROcodone-homatropine (HYCODAN) 5-1.5 MG/5ML syrup Take 5 mLs by mouth every 8 (eight) hours as needed for cough. 01/07/13   Fayrene Helper, PA-C   BP 134/83 mmHg  Pulse 92  Temp(Src) 97.5 F (36.4 C) (Oral)  Resp 20  Ht 5\' 5"  (1.651 m)  Wt 321 lb (145.605 kg)  BMI 53.42 kg/m2  SpO2 100%  LMP 09/11/2014 Physical Exam  Constitutional:  Awake, alert, nontoxic appearance.  HENT:  Head: Atraumatic.  Diffuse right cheek swelling without evidence of cellulitis or abscess. No trismus, glossal elevation, unilateral tonsillar swelling. Patent airway, tolerating secretions well.  Eyes: Right eye exhibits no discharge. Left eye exhibits no discharge.  Neck: Neck supple.  Pulmonary/Chest: Effort normal. She exhibits no tenderness.  Abdominal: Soft. There is no tenderness. There is no rebound.  Musculoskeletal: She exhibits no tenderness.  Baseline ROM, no obvious new focal weakness.  Neurological:  Mental status and motor strength appears baseline for patient and situation.  Skin: No rash noted.  Psychiatric: She has a normal mood and affect.  Nursing note and vitals reviewed.   ED Course  Procedures (including critical care  time) Labs Review Labs Reviewed - No data to display  Imaging Review No results found.   EKG Interpretation None     Meds given in ED:  Medications - No data to display  Discharge Medication List as of 09/11/2014 10:06 AM    START taking these medications   Details  acetaminophen-codeine (TYLENOL #3) 300-30 MG per tablet Take 1-2 tablets by mouth every 6 (six) hours as needed for moderate pain., Starting 09/11/2014, Until Discontinued, Print       Filed Vitals:   09/11/14 0917  BP: 134/83  Pulse: 92   Temp: 97.5 F (36.4 C)  TempSrc: Oral  Resp: 20  Height:  (1.651 m)  Weight: 321 lb (145.605 kg)  SpO2: 100%    MDM  Vitals stable - WNL -afebrile Pt resting comfortably in ED. PE--moderate right cheek swelling, no evidence of overt dental infection or cheek cellulitis. No indication for antibiotics at this time. No evidence of Ludwig angina, retropharyngeal or peritonsillar abscess. Patient given referral to ENT for further evaluation of possible salivary stone. Also encouraged to keep appointment with her dentist for regularly scheduled appointment in 2 weeks. Patient agrees with this plan. No evidence of other acute or emergent pathology at this time. I discussed all relevant lab findings and imaging results with pt and they verbalized understanding. Discussed f/u with PCP within 48 hrs and return precautions, pt very amenable to plan.  Final diagnoses:  Cheek swelling        Joycie Peek, PA-C 09/12/14 1610  Gwyneth Sprout, MD 09/17/14 2148

## 2014-09-11 NOTE — Discharge Instructions (Signed)
Please take your medications as directed. Do not take these medications before driving or operating machinery. Follow up with ENT, Dr. Jenne Pane for reevaluation. Continue trying to stimulate saliva with hard candies and lemons every 2-3 hours. Return to ED for new or worsening symptoms.

## 2014-09-11 NOTE — ED Notes (Signed)
Pt has HX of salivary gland stone and reports this episode feels like the same. Pt reports she has tried the lemon drop but has not worked. Pt also took ibuprofen with out pain relief.

## 2014-12-12 ENCOUNTER — Encounter (HOSPITAL_COMMUNITY): Payer: Self-pay | Admitting: Emergency Medicine

## 2014-12-12 ENCOUNTER — Emergency Department (HOSPITAL_COMMUNITY)
Admission: EM | Admit: 2014-12-12 | Discharge: 2014-12-12 | Disposition: A | Discharge status: Home / Self Care | Payer: Self-pay | Attending: Emergency Medicine | Admitting: Emergency Medicine

## 2014-12-12 DIAGNOSIS — H6091 Unspecified otitis externa, right ear: Secondary | ICD-10-CM | POA: Insufficient documentation

## 2014-12-12 DIAGNOSIS — H6691 Otitis media, unspecified, right ear: Secondary | ICD-10-CM | POA: Insufficient documentation

## 2014-12-12 DIAGNOSIS — Z8719 Personal history of other diseases of the digestive system: Secondary | ICD-10-CM | POA: Insufficient documentation

## 2014-12-12 DIAGNOSIS — Z7951 Long term (current) use of inhaled steroids: Secondary | ICD-10-CM | POA: Insufficient documentation

## 2014-12-12 DIAGNOSIS — Z8679 Personal history of other diseases of the circulatory system: Secondary | ICD-10-CM | POA: Insufficient documentation

## 2014-12-12 DIAGNOSIS — Z8659 Personal history of other mental and behavioral disorders: Secondary | ICD-10-CM | POA: Insufficient documentation

## 2014-12-12 DIAGNOSIS — Z87891 Personal history of nicotine dependence: Secondary | ICD-10-CM | POA: Insufficient documentation

## 2014-12-12 MED ORDER — AMOXICILLIN 500 MG PO CAPS: 500.0000 mg | ORAL_CAPSULE | Freq: Three times a day (TID) | ORAL | Status: DC

## 2014-12-12 MED ORDER — GUAIFENESIN-CODEINE 100-10 MG/5ML PO SOLN
5.0000 mL | Freq: Four times a day (QID) | ORAL | Status: DC | PRN
Start: 2014-12-12 — End: 2018-03-16

## 2014-12-12 MED ORDER — CIPROFLOXACIN-DEXAMETHASONE 0.3-0.1 % OT SUSP
4.0000 [drp] | Freq: Two times a day (BID) | OTIC | Status: DC
  Administered 2014-12-12: 4 [drp] via OTIC
  Filled 2014-12-12: qty 7.5

## 2014-12-12 NOTE — ED Notes (Signed)
Pt reports pain in r/ear x 2 days. Pain unresponsive to motrin

## 2014-12-12 NOTE — Discharge Instructions (Signed)
Ear Drops, Adult °You have been diagnosed with a condition requiring you to put drops of medicine into your outer ear. °HOME CARE INSTRUCTIONS  °· Put drops in the affected ear as instructed. After putting the drops in, you will need to lie down with the affected ear facing up for ten minutes so the drops will remain in the ear canal and run down and fill the canal. Continue using the ear drops for as long as directed by your health care provider. °· Prior to getting up, put a cotton ball gently in your ear canal. Leave enough of the cotton ball out so it can be easily removed. Do not attempt to push this down into the canal with a cotton-tipped swab or other instrument. °· Do not irrigate or wash out your ears if you have had a perforated eardrum or mastoid surgery, or unless instructed to do so by your health care provider. °· Keep appointments with your health care provider as instructed. °· Finish all medicine, or use for the length of time prescribed by your health care provider. Continue the drops even if your problem seems to be doing well after a couple days, or continue as instructed. °SEEK MEDICAL CARE IF: °· You become worse or develop increasing pain. °· You notice any unusual drainage from your ear (particularly if the drainage has a bad smell). °· You develop hearing difficulties. °· You experience a serious form of dizziness in which you feel as if the room is spinning, and you feel nauseated (vertigo). °· The outside of your ear becomes red or swollen or both. This may be a sign of an allergic reaction. °MAKE SURE YOU:  °· Understand these instructions. °· Will watch your condition. °· Will get help right away if you are not doing well or get worse. °  °This information is not intended to replace advice given to you by your health care provider. Make sure you discuss any questions you have with your health care provider. °  °Document Released: 01/13/2001 Document Revised: 02/09/2014 Document  Reviewed: 08/16/2012 °Elsevier Interactive Patient Education ©2016 [employer] ° °Otitis Externa °Otitis externa is a bacterial or fungal infection of the outer ear canal. This is the area from the eardrum to the outside of the ear. Otitis externa is sometimes called "swimmer's ear." °CAUSES  °Possible causes of infection include: °· Swimming in dirty water. °· Moisture remaining in the ear after swimming or bathing. °· Mild injury (trauma) to the ear. °· Objects stuck in the ear (foreign body). °· Cuts or scrapes (abrasions) on the outside of the ear. °SIGNS AND SYMPTOMS  °The first symptom of infection is often itching in the ear canal. Later signs and symptoms may include swelling and redness of the ear canal, ear pain, and yellowish-white fluid (pus) coming from the ear. The ear pain may be worse when pulling on the earlobe. °DIAGNOSIS  °Your health care provider will perform a physical exam. A sample of fluid may be taken from the ear and examined for bacteria or fungi. °TREATMENT  °Antibiotic ear drops are often given for 10 to 14 days. Treatment may also include pain medicine or corticosteroids to reduce itching and swelling. °HOME CARE INSTRUCTIONS  °· Apply antibiotic ear drops to the ear canal as prescribed by your health care provider. °· Take medicines only as directed by your health care provider. °· If you have diabetes, follow any additional treatment instructions from your health care provider. °· Keep all follow-up visits   as directed by your health care provider. PREVENTION   Keep your ear dry. Use the corner of a towel to absorb water out of the ear canal after swimming or bathing.  Avoid scratching or putting objects inside your ear. This can damage the ear canal or remove the protective wax that lines the canal. This makes it easier for bacteria and fungi to grow.  Avoid swimming in lakes, polluted water, or poorly chlorinated pools.  You may use ear drops made of rubbing alcohol and  vinegar after swimming. Combine equal parts of white vinegar and alcohol in a bottle. Put 3 or 4 drops into each ear after swimming. SEEK MEDICAL CARE IF:   You have a fever.  Your ear is still red, swollen, painful, or draining pus after 3 days.  Your redness, swelling, or pain gets worse.  You have a severe headache.  You have redness, swelling, pain, or tenderness in the area behind your ear. MAKE SURE YOU:   Understand these instructions.  Will watch your condition.  Will get help right away if you are not doing well or get worse.   This information is not intended to replace advice given to you by your health care provider. Make sure you discuss any questions you have with your health care provider.   Document Released: 01/19/2005 Document Revised: 02/09/2014 Document Reviewed: 02/05/2011 Elsevier Interactive Patient Education 2016 [employer].  Otitis Media, Adult Otitis media is redness, soreness, and inflammation of the middle ear. Otitis media may be caused by allergies or, most commonly, by infection. Often it occurs as a complication of the common cold. SIGNS AND SYMPTOMS Symptoms of otitis media may include:  Earache.  Fever.  Ringing in your ear.  Headache.  Leakage of fluid from the ear. DIAGNOSIS To diagnose otitis media, your health care provider will examine your ear with an otoscope. This is an instrument that allows your health care provider to see into your ear in order to examine your eardrum. Your health care provider also will ask you questions about your symptoms. TREATMENT  Typically, otitis media resolves on its own within 3-5 days. Your health care provider may prescribe medicine to ease your symptoms of pain. If otitis media does not resolve within 5 days or is recurrent, your health care provider may prescribe antibiotic medicines if he or she suspects that a bacterial infection is the cause. HOME CARE INSTRUCTIONS   If you were prescribed  an antibiotic medicine, finish it all even if you start to feel better.  Take medicines only as directed by your health care provider.  Keep all follow-up visits as directed by your health care provider. SEEK MEDICAL CARE IF:  You have otitis media only in one ear, or bleeding from your nose, or both.  You notice a lump on your neck.  You are not getting better in 3-5 days.  You feel worse instead of better. SEEK IMMEDIATE MEDICAL CARE IF:   You have pain that is not controlled with medicine.  You have swelling, redness, or pain around your ear or stiffness in your neck.  You notice that part of your face is paralyzed.  You notice that the bone behind your ear (mastoid) is tender when you touch it. MAKE SURE YOU:   Understand these instructions.  Will watch your condition.  Will get help right away if you are not doing well or get worse.   This information is not intended to replace advice given to you  by your health care provider. Make sure you discuss any questions you have with your health care provider. °  °Document Released: 10/25/2003 Document Revised: 02/09/2014 Document Reviewed: 08/16/2012 °Elsevier Interactive Patient Education ©2016 [employer] Inc. ° °

## 2014-12-12 NOTE — ED Provider Notes (Signed)
CSN: [id number]     Arrival date & time 12/12/14  1016 History   First MD Initiated Contact with Patient 12/12/14 1045     Chief Complaint  Patient presents with  . Otalgia    r/ear pain x 2 days     (Consider location/radiation/quality/duration/timing/severity/associated sxs/prior Treatment) Patient is a 29 y.o. female presenting with ear pain. The history is provided by the patient. No language interpreter was used.  Otalgia Location:  Right Behind ear:  No abnormality Quality:  Aching, throbbing, pressure and dull Severity:  Moderate Onset quality:  Gradual Duration:  2 days Timing:  Constant Progression:  Worsening Chronicity:  New Context: not direct blow, not elevation change, not foreign body in ear, not loud noise and no water in ear   Relieved by:  Ice and OTC medications Worsened by:  Swallowing and palpation Associated symptoms: congestion   Associated symptoms: no abdominal pain, no cough, no diarrhea, no ear discharge, no fever, no headaches, no hearing loss, no neck pain, no rash, no rhinorrhea, no sore throat, no tinnitus and no vomiting   Risk factors: no recent travel, no chronic ear infection and no prior ear surgery     Past Medical History  Diagnosis Date  . Anxiety   . Depression   . Migraines   . Salivary calculus    Past Surgical History  Procedure Laterality Date  . No past surgeries     Family History  Problem Relation Age of Onset  . Hyperlipidemia Maternal Grandmother   . Diabetes Maternal Grandmother   . Cancer Maternal Grandfather     prostate  . Hyperlipidemia Maternal Grandfather   . Early death Neg Hx   . Stroke Neg Hx    Social History  Substance Use Topics  . Smoking status: Former Games developermoker  . Smokeless tobacco: None  . Alcohol Use: No   OB History    Gravida Para Term Preterm AB TAB SAB Ectopic Multiple Living   1    1  1         Review of Systems  Constitutional: Negative for fever and chills.  HENT: Positive for  congestion and ear pain. Negative for ear discharge, hearing loss, rhinorrhea, sore throat and tinnitus.   Respiratory: Negative for cough.   Gastrointestinal: Negative for vomiting, abdominal pain and diarrhea.  Musculoskeletal: Negative for neck pain.  Skin: Negative for rash.  Neurological: Negative for headaches.  All other systems reviewed and are negative.     Allergies  Shellfish allergy  Home Medications   Prior to Admission medications   Medication Sig Start Date End Date Taking? Authorizing Provider  fluticasone (FLONASE) 50 MCG/ACT nasal spray Place 1 spray into both nostrils daily.   Yes Historical Provider, MD  ibuprofen (ADVIL,MOTRIN) 200 MG tablet Take 800 mg by mouth every 6 (six) hours as needed.   Yes Historical Provider, MD  acetaminophen-codeine (TYLENOL #3) 300-30 MG per tablet Take 1-2 tablets by mouth every 6 (six) hours as needed for moderate pain. Patient not taking: Reported on 12/12/2014 09/11/14   Joycie PeekBenjamin Cartner, PA-C  amoxicillin (AMOXIL) 500 MG capsule Take 1 capsule (500 mg total) by mouth 3 (three) times daily. 12/12/14   Arthor CaptainAbigail Kayren Holck, PA-C  guaiFENesin-codeine 100-10 MG/5ML syrup Take 5-10 mLs by mouth every 6 (six) hours as needed for cough. 12/12/14   Triston Skare, PA-C  HYDROcodone-homatropine (HYCODAN) 5-1.5 MG/5ML syrup Take 5 mLs by mouth every 8 (eight) hours as needed for cough. Patient not taking: Reported on  12/12/2014 01/07/13   Fayrene Helper, PA-C   BP 128/67 mmHg  Pulse 82  Temp(Src) 98 F (36.7 C) (Oral)  Resp 18  SpO2 98%  LMP 10/19/2014 Physical Exam  Constitutional: She is oriented to person, place, and time. She appears well-developed and well-nourished. No distress.  HENT:  Head: Normocephalic and atraumatic.  Right Ear: There is swelling (of the canal with redness) and tenderness. No drainage. No foreign bodies. No mastoid tenderness. Tympanic membrane is bulging. No middle ear effusion. No decreased hearing is noted.  Left Ear:  Hearing, tympanic membrane, external ear and ear canal normal. No lacerations. No drainage, swelling or tenderness. No mastoid tenderness.  Ears:  Eyes: Conjunctivae are normal. No scleral icterus.  Neck: Normal range of motion.  Cardiovascular: Normal rate, regular rhythm and normal heart sounds.  Exam reveals no gallop and no friction rub.   No murmur heard. Pulmonary/Chest: Effort normal and breath sounds normal. No respiratory distress.  Abdominal: Soft. Bowel sounds are normal. She exhibits no distension and no mass. There is no tenderness. There is no guarding.  Neurological: She is alert and oriented to person, place, and time.  Skin: Skin is warm and dry. She is not diaphoretic.    ED Course  Procedures (including critical care time) Labs Review Labs Reviewed - No data to display  Imaging Review No results found. I have personally reviewed and evaluated these images and lab results as part of my medical decision-making.   EKG Interpretation None      MDM   Final diagnoses:  Otitis externa, right  Acute right otitis media, recurrence not specified, unspecified otitis media type    11:47 AM BP 128/67 mmHg  Pulse 82  Temp(Src) 98 F (36.7 C) (Oral)  Resp 18  SpO2 98%  LMP 10/19/2014 patient with otitis externa, otitis media. She has a bulging drum. There is an area of a creamy yellow appearing nodule behind the eardrum. I have suspicion for possible cholesteatoma. I've asked that the patient follow closely with ear, nose and throat. She will be discharged with Ciprodex drops, amoxicillin, ENT follow-up, and codeine cough syrup for pain relief at night. Her safe for discharge at this time.   Arthor Captain, PA-C 12/12/14 1150  Melene Plan, DO 12/12/14 1627

## 2014-12-12 NOTE — ED Notes (Signed)
Disregard all documentation prior to this point, accidentally charted on a different patient. Notified PA

## 2014-12-12 NOTE — ED Notes (Signed)
Mother states cough x 2 days with runny nose. Lungs clear in all fields, mother denies fever.

## 2015-08-19 ENCOUNTER — Emergency Department (HOSPITAL_COMMUNITY)
Admission: EM | Admit: 2015-08-19 | Discharge: 2015-08-19 | Disposition: A | Discharge status: Home / Self Care | Payer: No Typology Code available for payment source | Attending: Emergency Medicine | Admitting: Emergency Medicine

## 2015-08-19 ENCOUNTER — Emergency Department (HOSPITAL_COMMUNITY): Payer: No Typology Code available for payment source

## 2015-08-19 ENCOUNTER — Encounter (HOSPITAL_COMMUNITY): Payer: Self-pay

## 2015-08-19 DIAGNOSIS — Y939 Activity, unspecified: Secondary | ICD-10-CM | POA: Diagnosis not present

## 2015-08-19 DIAGNOSIS — Y92481 Parking lot as the place of occurrence of the external cause: Secondary | ICD-10-CM | POA: Insufficient documentation

## 2015-08-19 DIAGNOSIS — Y999 Unspecified external cause status: Secondary | ICD-10-CM | POA: Diagnosis not present

## 2015-08-19 DIAGNOSIS — M79605 Pain in left leg: Secondary | ICD-10-CM | POA: Insufficient documentation

## 2015-08-19 DIAGNOSIS — M549 Dorsalgia, unspecified: Secondary | ICD-10-CM | POA: Insufficient documentation

## 2015-08-19 DIAGNOSIS — F17200 Nicotine dependence, unspecified, uncomplicated: Secondary | ICD-10-CM | POA: Diagnosis not present

## 2015-08-19 LAB — PREGNANCY, URINE: Preg Test, Ur: NEGATIVE

## 2015-08-19 MED ORDER — CYCLOBENZAPRINE HCL 5 MG PO TABS: 5.0000 mg | ORAL_TABLET | Freq: Three times a day (TID) | ORAL | Status: DC | PRN
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
## 2015-08-20 ENCOUNTER — Telehealth (HOSPITAL_BASED_OUTPATIENT_CLINIC_OR_DEPARTMENT_OTHER): Payer: Self-pay

## 2015-08-30 ENCOUNTER — Encounter (HOSPITAL_COMMUNITY): Payer: Self-pay | Admitting: Emergency Medicine

## 2015-10-18 ENCOUNTER — Encounter (HOSPITAL_COMMUNITY): Payer: Self-pay | Admitting: Emergency Medicine

## 2015-10-18 ENCOUNTER — Emergency Department (HOSPITAL_COMMUNITY)
Admission: EM | Admit: 2015-10-18 | Discharge: 2015-10-18 | Disposition: A | Discharge status: Home / Self Care | Payer: 59 | Attending: Emergency Medicine | Admitting: Emergency Medicine

## 2015-10-18 DIAGNOSIS — J029 Acute pharyngitis, unspecified: Secondary | ICD-10-CM | POA: Insufficient documentation

## 2015-10-18 DIAGNOSIS — R197 Diarrhea, unspecified: Secondary | ICD-10-CM | POA: Diagnosis not present

## 2015-10-18 DIAGNOSIS — Z791 Long term (current) use of non-steroidal anti-inflammatories (NSAID): Secondary | ICD-10-CM | POA: Diagnosis not present

## 2015-10-18 DIAGNOSIS — Z792 Long term (current) use of antibiotics: Secondary | ICD-10-CM | POA: Insufficient documentation

## 2015-10-18 DIAGNOSIS — F17200 Nicotine dependence, unspecified, uncomplicated: Secondary | ICD-10-CM | POA: Diagnosis not present

## 2015-10-18 MED ORDER — NAPROXEN 500 MG PO TABS: 500.0000 mg | ORAL_TABLET | Freq: Two times a day (BID) | ORAL | 0 refills | Status: DC

## 2015-10-18 NOTE — Discharge Instructions (Signed)
SEEK IMMEDIATE MEDICAL CARE IF:  · You have difficulty breathing.  · You are unable to swallow fluids, soft foods, or your saliva.  · You have increased swelling in the throat.  · Your sore throat does not get better in 7 days.  · You have nausea and vomiting.  · You have a fever or persistent symptoms for more than 2-3 days.  · You have a fever and your symptoms suddenly get worse.

## 2015-10-18 NOTE — ED Triage Notes (Addendum)
Pt to ED for sore throat x 2 days. Pt denies any SOB, cough or vomiting. Pt states she does have diarrhea. Denies abdominal pain. Lymph nodes are tender to touch and tonsils are inflamed, but no exudate seen. Pt took tylenol at 0400 and muscle relaxer at 0800.

## 2015-10-18 NOTE — ED Provider Notes (Signed)
[facility]-EMERGENCY DEPT Provider Note   CSN: [id number] Arrival date & time: 10/18/15  1209  By signing my name below, I, Avnee Patel, attest that this documentation has been prepared under the direction and in the presence of  Arthor CaptainAbigail Kaylib Furness, PA-C. Electronically Signed: Clovis PuAvnee Patel, ED Scribe. 10/18/15. 12:49 PM.    History   Chief Complaint Chief Complaint  Patient presents with  . Sore Throat    The history is provided by the patient. No language interpreter was used.   HPI Comments:  Anne Shaffer is a 30 y.o. female who presents to the Emergency Department complaining of constant, moderate sore throat onset 2 days ago. Pt describes the severity of the pain as being a "6/10". She notes mild discomfort when swallowing. Pt notes associated diarrhea and chills onset 1 day ago. She denies any sick contact, SOB, fevers, cough, tooth pain, rhinorrhea, or a hx of strep throat. Pt notes she has had 5 episodes of loose stool diarrhea. Pt has taken tylenol with little relief.  Pt notes she has allergies.    Past Medical History:  Diagnosis Date  . Anxiety   . Depression   . Migraines   . Salivary calculus     Patient Active Problem List   Diagnosis Date Noted  . Salivary calculus   . Severe obesity (BMI >= 40) (HCC) 12/02/2012  . THYROID NODULE 12/03/2008  . 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 NO PAST SURGERIES      OB History    Gravida Para Term Preterm AB Living   1 0 0 0 1     SAB TAB Ectopic Multiple Live Births   1 0 0           Home Medications    Prior to Admission medications   Medication Sig Start Date End Date Taking? Authorizing Provider  acetaminophen-codeine (TYLENOL #3) 300-30 MG per tablet Take 1-2 tablets by mouth every 6 (six) hours as needed for moderate pain. Patient not taking: Reported on 12/12/2014 09/11/14   Joycie PeekBenjamin Cartner, PA-C  amoxicillin (AMOXIL) 500 MG capsule Take 1  capsule (500 mg total) by mouth 3 (three) times daily. 12/12/14   Arthor CaptainAbigail Jossue Rubenstein, PA-C  cyclobenzaprine (FLEXERIL) 5 MG tablet Take 1 tablet (5 mg total) by mouth 3 (three) times daily as needed for muscle spasms. 08/19/15   Lona KettleAshley Laurel Meyer, PA-C  fluticasone [facility](FLONASE) 50 MCG/ACT nasal spray Place 1 spray into both nostrils daily.    Historical Provider, MD  guaiFENesin-codeine 100-10 MG/5ML syrup Take 5-10 mLs by mouth every 6 (six) hours as needed for cough. 12/12/14   Sherrika Weakland, PA-C  HYDROcodone-homatropine (HYCODAN) 5-1.5 MG/5ML syrup Take 5 mLs by mouth every 8 (eight) hours as needed for cough. Patient not taking: Reported on 12/12/2014 01/07/13   Fayrene HelperBowie Tran, PA-C  ibuprofen (ADVIL,MOTRIN) 200 MG tablet Take 800 mg by mouth every 6 (six) hours as needed.    Historical Provider, MD    Family History Family History  Problem Relation Age of Onset  . Hyperlipidemia Maternal Grandmother   . Diabetes Maternal Grandmother   . Cancer Maternal Grandfather     prostate  . Hyperlipidemia Maternal Grandfather   . Early death Neg Hx   . Stroke Neg Hx     Social History Social History  Substance Use Topics  . Smoking status: Current Some Day Smoker  . Smokeless tobacco: Never Used  . Alcohol use Yes  Comment: social     Allergies   Shellfish allergy and Shellfish allergy   Review of Systems Review of Systems  Constitutional: Positive for chills. Negative for fever.  HENT: Positive for sore throat. Negative for rhinorrhea.   Respiratory: Negative for cough and shortness of breath.   Gastrointestinal: Positive for diarrhea.  All other systems reviewed and are negative.    Physical Exam Updated Vital Signs BP 149/96 (BP Location: Left Arm)   Pulse 98   Temp 98.5 F (36.9 C) (Oral)   Resp 18   Ht 5\' 5"  (1.651 m)   Wt (!) 335 lb (152 kg)   LMP 09/15/2015 (Approximate)   SpO2 97%   BMI 55.75 kg/m   Physical Exam  Constitutional: Pt appears well-developed and  well-nourished. No distress.  Awake, alert, nontoxic appearance  HENT:  Head: Normocephalic and atraumatic.  Mouth/Throat: Oropharynx is clear and moist. No oropharyngeal exudate. No tonsillar swelling. Eyes: Conjunctivae are normal. No scleral icterus.  Neck: Normal range of motion. Neck supple.  Cardiovascular: Normal rate, regular rhythm and intact distal pulses.   Pulmonary/Chest: Effort normal and breath sounds normal. No respiratory distress. Pt has no wheezes.  Equal chest expansion  Abdominal: Soft. Bowel sounds are normal. Pt exhibits no mass. There is no tenderness. There is no rebound and no guarding.  Musculoskeletal: Normal range of motion. Pt exhibits no edema.  Neurological: Pt is alert.  Speech is clear and goal oriented Moves extremities without ataxia  Skin: Skin is warm and dry. Pt is not diaphoretic.  Psychiatric: Pt has a normal mood and affect.  Nursing note and vitals reviewed.   ED Treatments / Results  DIAGNOSTIC STUDIES:  Oxygen Saturation is 97% on RA, normal by my interpretation.    COORDINATION OF CARE:  12:35 PM Discussed treatment plan with pt at bedside and pt agreed to plan.  Labs (all labs ordered are listed, but only abnormal results are displayed) Labs Reviewed - No data to display  EKG  EKG Interpretation None       Radiology No results found.  Procedures Procedures (including critical care time)  Medications Ordered in ED Medications - No data to display   Initial Impression / Assessment and Plan / ED Course  I have reviewed the triage vital signs and the nursing notes.  Pertinent labs & imaging results that were available during my care of the patient were reviewed by me and considered in my medical decision making (see chart for details).  Clinical Course    No abx indicated at this time. Discharge with symptomatic tx. No evidence of dehydration. Pt is tolerating secretions. Presentation not concerning for peritonsillar  abscess or spread of infection to deep spaces of the throat; patent airway. Specific return precautions discussed. Recommended PCP follow up. Pt appears safe for discharge.   Final Clinical Impressions(s) / ED Diagnoses   Final diagnoses:  Viral pharyngitis    New Prescriptions New Prescriptions   No medications on file  I personally performed the services described in this documentation, which was scribed in my presence. The recorded information has been reviewed and is accurate.      Arthor Captain, PA-C 10/21/15 1631    Lyndal Pulley, MD 10/22/15 [phone]

## 2015-12-07 DIAGNOSIS — Z7689 Persons encountering health services in other specified circumstances: Secondary | ICD-10-CM | POA: Diagnosis not present

## 2015-12-07 DIAGNOSIS — J208 Acute bronchitis due to other specified organisms: Secondary | ICD-10-CM | POA: Diagnosis not present

## 2015-12-07 DIAGNOSIS — N926 Irregular menstruation, unspecified: Secondary | ICD-10-CM | POA: Diagnosis not present

## 2016-02-17 DIAGNOSIS — R030 Elevated blood-pressure reading, without diagnosis of hypertension: Secondary | ICD-10-CM | POA: Diagnosis not present

## 2016-02-17 DIAGNOSIS — B9789 Other viral agents as the cause of diseases classified elsewhere: Secondary | ICD-10-CM | POA: Diagnosis not present

## 2016-02-17 DIAGNOSIS — J069 Acute upper respiratory infection, unspecified: Secondary | ICD-10-CM | POA: Diagnosis not present

## 2016-02-17 DIAGNOSIS — Z7689 Persons encountering health services in other specified circumstances: Secondary | ICD-10-CM | POA: Diagnosis not present

## 2016-02-17 DIAGNOSIS — R110 Nausea: Secondary | ICD-10-CM | POA: Diagnosis not present

## 2016-03-06 DIAGNOSIS — G4700 Insomnia, unspecified: Secondary | ICD-10-CM | POA: Diagnosis not present

## 2016-03-06 DIAGNOSIS — I10 Essential (primary) hypertension: Secondary | ICD-10-CM | POA: Diagnosis not present

## 2016-03-06 DIAGNOSIS — F419 Anxiety disorder, unspecified: Secondary | ICD-10-CM | POA: Diagnosis not present

## 2016-03-27 DIAGNOSIS — I10 Essential (primary) hypertension: Secondary | ICD-10-CM | POA: Diagnosis not present

## 2016-03-27 DIAGNOSIS — R0683 Snoring: Secondary | ICD-10-CM | POA: Diagnosis not present

## 2016-03-27 DIAGNOSIS — Z6841 Body Mass Index (BMI) 40.0 and over, adult: Secondary | ICD-10-CM | POA: Diagnosis not present

## 2016-12-03 DIAGNOSIS — J0100 Acute maxillary sinusitis, unspecified: Secondary | ICD-10-CM | POA: Diagnosis not present

## 2016-12-03 DIAGNOSIS — R05 Cough: Secondary | ICD-10-CM | POA: Diagnosis not present

## 2018-03-06 ENCOUNTER — Other Ambulatory Visit: Payer: Self-pay

## 2018-03-06 ENCOUNTER — Emergency Department (HOSPITAL_COMMUNITY): Payer: Self-pay

## 2018-03-06 ENCOUNTER — Emergency Department (HOSPITAL_COMMUNITY)
Admission: EM | Admit: 2018-03-06 | Discharge: 2018-03-07 | Disposition: A | Discharge status: Home / Self Care | Payer: Self-pay | Attending: Emergency Medicine | Admitting: Emergency Medicine

## 2018-03-06 ENCOUNTER — Encounter (HOSPITAL_COMMUNITY): Payer: Self-pay

## 2018-03-06 DIAGNOSIS — Z79899 Other long term (current) drug therapy: Secondary | ICD-10-CM | POA: Insufficient documentation

## 2018-03-06 DIAGNOSIS — E010 Iodine-deficiency related diffuse (endemic) goiter: Secondary | ICD-10-CM | POA: Insufficient documentation

## 2018-03-06 DIAGNOSIS — F17200 Nicotine dependence, unspecified, uncomplicated: Secondary | ICD-10-CM | POA: Insufficient documentation

## 2018-03-06 DIAGNOSIS — F419 Anxiety disorder, unspecified: Secondary | ICD-10-CM | POA: Insufficient documentation

## 2018-03-06 DIAGNOSIS — R079 Chest pain, unspecified: Secondary | ICD-10-CM | POA: Insufficient documentation

## 2018-03-06 DIAGNOSIS — I10 Essential (primary) hypertension: Secondary | ICD-10-CM | POA: Insufficient documentation

## 2018-03-06 DIAGNOSIS — F329 Major depressive disorder, single episode, unspecified: Secondary | ICD-10-CM | POA: Insufficient documentation

## 2018-03-06 LAB — BASIC METABOLIC PANEL
Anion gap: 7 (ref 5–15)
BUN: 9 mg/dL (ref 6–20)
CO2: 29 mmol/L (ref 22–32)
Calcium: 9.3 mg/dL (ref 8.9–10.3)
Chloride: 100 mmol/L (ref 98–111)
Creatinine, Ser: 0.74 mg/dL (ref 0.44–1.00)
GFR calc non Af Amer: >60 mL/min (ref >60)
Glucose, Bld: 129 mg/dL — ABNORMAL HIGH (ref 70–99)
POTASSIUM: 3.5 mmol/L (ref 3.5–5.1)
Sodium: 136 mmol/L (ref 135–145)

## 2018-03-06 LAB — CBC
HCT: 38.9 % (ref 36.0–46.0)
Hemoglobin: 10.9 g/dL — ABNORMAL LOW (ref 12.0–15.0)
MCH: 21.4 pg — ABNORMAL LOW (ref 26.0–34.0)
MCHC: 28 g/dL — ABNORMAL LOW (ref 30.0–36.0)
MCV: 76.4 fL — ABNORMAL LOW (ref 80.0–100.0)
Platelets: 365 10*3/uL (ref 150–400)
RBC: 5.09 MIL/uL (ref 3.87–5.11)
RDW: 17.6 % — ABNORMAL HIGH (ref 11.5–15.5)
WBC: 8.5 10*3/uL (ref 4.0–10.5)
nRBC: 0 % (ref 0.0–0.2)

## 2018-03-06 LAB — POCT I-STAT TROPONIN I: TROPONIN I, POC: 0 ng/mL (ref 0.00–0.08)

## 2018-03-06 LAB — I-STAT BETA HCG BLOOD, ED (NOT ORDERABLE)

## 2018-03-06 MED ORDER — SODIUM CHLORIDE 0.9% FLUSH
3.0000 mL | Freq: Once | INTRAVENOUS | Status: AC
  Administered 2018-03-06: 3 mL via INTRAVENOUS

## 2018-03-06 NOTE — ED Provider Notes (Signed)
[facility]-EMERGENCY DEPT Provider Note   CSN: [id number] Arrival date & time: 03/06/18  2154     History   Chief Complaint Chief Complaint  Patient presents with  . Chest Pain    HPI Anne Shaffer is a 33 y.o. female.  The history is provided by the patient and medical records.  Chest Pain  Associated symptoms: shortness of breath      33 y.o. F with hx of anxiety, depression, migraine headaches, thyroid nodules, presenting to the ED for chest pain.  States has been intermittent for about a week.  States it is left sided, coming and going.  Not sure what makes it better or worse.  States she feels SOB almost all the time, does seem worse when up and walking around.  No cough, fever, nasal congestion, or other URI symptoms.  No prior cardiac history.  Does have hx of HTN, has been off her meds for a few months now since she lost her insurance.  No known family cardiac history.  She is not a smoker.  Patient does report she has been under a great deal of stress, a lot of which is financial.  She is currently working 2 part-time jobs to try and make ends meet.  States she just feels anxious all the time.  She has not been sleeping because of this.  She denies any suicidal homicidal ideation.  No hallucinations.  Past Medical History:  Diagnosis Date  . Anxiety   . Depression   . Migraines   . Salivary calculus     Patient Active Problem List   Diagnosis Date Noted  . Salivary calculus   . Severe obesity (BMI >= 40) (HCC) 12/02/2012  . THYROID NODULE 12/03/2008  . 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 NO PAST SURGERIES       OB History    Gravida  1   Para  0   Term  0   Preterm  0   AB  1   Living        SAB  1   TAB  0   Ectopic  0   Multiple      Live Births               Home Medications    Prior to Admission medications   Medication Sig Start Date End Date  Taking? Authorizing Provider  acetaminophen-codeine (TYLENOL #3) 300-30 MG per tablet Take 1-2 tablets by mouth every 6 (six) hours as needed for moderate pain. Patient not taking: Reported on 12/12/2014 09/11/14   Joycie Peekartner, Benjamin, PA-C  amoxicillin (AMOXIL) 500 MG capsule Take 1 capsule (500 mg total) by mouth 3 (three) times daily. 12/12/14   Arthor CaptainHarris, Abigail, PA-C  cyclobenzaprine (FLEXERIL) 5 MG tablet Take 1 tablet (5 mg total) by mouth 3 (three) times daily as needed for muscle spasms. 08/19/15   Deborha PaymentMeyer, Ashley L, PA-C  fluticasone (FLONASE) 50 MCG/ACT nasal spray Place 1 spray into both nostrils daily.    [provider]  guaiFENesin-codeine 100-10 MG/5ML syrup Take 5-10 mLs by mouth every 6 (six) hours as needed for cough. 12/12/14   Harris, Abigail, PA-C  HYDROcodone-homatropine (HYCODAN) 5-1.5 MG/5ML syrup Take 5 mLs by mouth every 8 (eight) hours as needed for cough. Patient not taking: Reported on 12/12/2014 01/07/13   Fayrene Helperran, Bowie, PA-C  ibuprofen (ADVIL,MOTRIN) 200 MG tablet Take 800 mg by mouth every 6 (  six) hours as needed.    [provider]  naproxen (NAPROSYN) 500 MG tablet Take 1 tablet (500 mg total) by mouth 2 (two) times daily. 10/18/15   Arthor Captain, PA-C    Family History Family History  Problem Relation Age of Onset  . Hyperlipidemia Maternal Grandmother   . Diabetes Maternal Grandmother   . Cancer Maternal Grandfather        prostate  . Hyperlipidemia Maternal Grandfather   . Early death Neg Hx   . Stroke Neg Hx     Social History Social History   Tobacco Use  . Smoking status: Current Some Day Smoker  . Smokeless tobacco: Never Used  Substance Use Topics  . Alcohol use: Yes    Comment: social  . Drug use: No     Allergies   Shellfish allergy and Shellfish allergy   Review of Systems Review of Systems  Respiratory: Positive for shortness of breath.   Cardiovascular: Positive for chest pain.  All other systems reviewed and are  negative.    Physical Exam Updated Vital Signs BP (!) 144/100 (BP Location: Right Arm)   Pulse (!) 108   Temp 98.5 F (36.9 C) (Oral)   Resp 20   LMP 01/19/2018 Comment: neg preg test  SpO2 94%   Physical Exam Vitals signs and nursing note reviewed.  Constitutional:      Appearance: She is well-developed. She is obese.  HENT:     Head: Normocephalic and atraumatic.  Eyes:     Conjunctiva/sclera: Conjunctivae normal.     Pupils: Pupils are equal, round, and reactive to light.  Neck:     Musculoskeletal: Normal range of motion.     Thyroid: Thyromegaly present.     Comments: Thyromegaly noted, worse on the left, no apparent tenderness; handling secretions well, normal phonation without stridor Cardiovascular:     Rate and Rhythm: Regular rhythm. Tachycardia present.     Heart sounds: Normal heart sounds.     Comments: Tachy to 114 during exam Pulmonary:     Effort: Pulmonary effort is normal.     Breath sounds: Normal breath sounds. No decreased breath sounds, wheezing or rhonchi.  Abdominal:     General: Bowel sounds are normal.     Palpations: Abdomen is soft.  Musculoskeletal: Normal range of motion.  Skin:    General: Skin is warm and dry.  Neurological:     Mental Status: She is alert and oriented to person, place, and time.  Psychiatric:        Attention and Perception: She does not perceive auditory or visual hallucinations.        Thought Content: Thought content does not include homicidal or suicidal ideation. Thought content does not include homicidal or suicidal plan.     Comments: tearful      ED Treatments / Results  Labs (all labs ordered are listed, but only abnormal results are displayed) Labs Reviewed  BASIC METABOLIC PANEL - Abnormal; Notable for the following components:      Result Value   Glucose, Bld 129 (*)    All other components within normal limits  CBC - Abnormal; Notable for the following components:   Hemoglobin 10.9 (*)    MCV  76.4 (*)    MCH 21.4 (*)    MCHC 28.0 (*)    RDW 17.6 (*)    All other components within normal limits  TSH  T4  I-STAT TROPONIN, ED  I-STAT BETA HCG BLOOD, ED ([facility],  [facility], [facility] ONLY)  POCT I-STAT TROPONIN I  I-STAT BETA HCG BLOOD, ED (NOT ORDERABLE)    EKG None  Radiology Dg Chest 2 View  Result Date: 03/06/2018 CLINICAL DATA:  Acute shortness of breath and chest pain for 1 week. History of thyroid nodules EXAM: CHEST - 2 VIEW COMPARISON:  05/26/2006 neck CT FINDINGS: The cardiomediastinal silhouette is unremarkable. The cervical trachea appears deviated to the RIGHT. Mild peribronchial thickening identified. There is no evidence of focal airspace disease, pulmonary edema, suspicious pulmonary nodule/mass, pleural effusion, or pneumothorax. No acute bony abnormalities are identified. IMPRESSION: 1. Mild peribronchial thickening without focal pneumonia. 2. Cervical trachea appears deviated to the RIGHT. Neck CT with contrast recommended for further evaluation. Electronically Signed   By: Harmon Pier M.D.   On: 03/06/2018 22:24   Ct Soft Tissue Neck W Contrast  Result Date: 03/07/2018 CLINICAL DATA:  LEFT chest pain for 1 week. Follow-up tracheal deviation. History of salivary calculus. EXAM: CT NECK WITH CONTRAST TECHNIQUE: Multidetector CT imaging of the neck was performed using the standard protocol following the bolus administration of intravenous contrast. CONTRAST:  100 mL ISOVUE-370 IOPAMIDOL (ISOVUE-370) INJECTION 76% COMPARISON:  Thyroid ultrasound March 25, 2007 FINDINGS: Mild motion degraded examination. PHARYNX AND LARYNX: Trachea deviated to the RIGHT due to thyromegaly. Patent airway. Normal pharynx. SALIVARY GLANDS: Normal. LEFT submandibular gland is anteriorly displaced by LEFT thyroid lobe. THYROID: LEFT thyroid lobe is 7 x 6.6 x 7.7 cm (transverse by AP by cc) with multiple hypodense nodules, largest 2.4 cm. Substernal extent of LEFT thyroid lobe. LYMPH NODES: No lymphadenopathy  by CT size criteria. VASCULAR: LEFT carotid arteries posteriorly displaced by LEFT thyroid lobe. LIMITED INTRACRANIAL: Normal. VISUALIZED ORBITS: Normal. MASTOIDS AND VISUALIZED PARANASAL SINUSES: Well-aerated. SKELETON: Nonacute. Poor dentition with multiple dental caries and periapical abscess. Calcified longus coli insertion. UPPER CHEST: Lung apices are clear. No superior mediastinal lymphadenopathy. OTHER: None. IMPRESSION: 1. No acute process in the neck. 2. LEFT thyromegaly displacing trachea to the RIGHT, patent airway. 3. **An incidental finding of potential clinical significance has been found. 2.4 cm dominant LEFT thyroid nodule. Recommend non emergent thyroid sonogram. This follows ACR consensus guidelines: Managing Incidental Thyroid Nodules Detected on Imaging: White Paper of the ACR Incidental Thyroid Findings Committee. J Am Coll Radiol 2015; 12:143-150.** Electronically Signed   By: Awilda Metro M.D.   On: 03/07/2018 01:41   Ct Angio Chest Pe W And/or Wo Contrast  Result Date: 03/07/2018 CLINICAL DATA:  LEFT chest pain for a week. EXAM: CT ANGIOGRAPHY CHEST WITH CONTRAST TECHNIQUE: Multidetector CT imaging of the chest was performed using the standard protocol during bolus administration of intravenous contrast. Multiplanar CT image reconstructions and MIPs were obtained to evaluate the vascular anatomy. CONTRAST:  100 mL ISOVUE-370 IOPAMIDOL (ISOVUE-370) INJECTION 76% COMPARISON:  Chest radiograph March 06, 2018 FINDINGS: CARDIOVASCULAR: Suboptimal contrast opacification of the pulmonary artery's (125 Hounsfield units, target is 250 Hounsfield units. Main pulmonary artery is not enlarged. No pulmonary arterial filling defects to the level of the segmental branches, limited by respiratory motion. Heart size is upper limits of normal. No pericardial effusion. Thoracic aorta is normal course and caliber, unremarkable. MEDIASTINUM/NODES: No lymphadenopathy by CT size criteria. LUNGS/PLEURA:  Tracheobronchial tree is patent, no pneumothorax. Trachea deviated to the RIGHT due to the LEFT thyromegaly. No pleural effusions, focal consolidations, pulmonary nodules or masses. Lingular atelectasis. UPPER ABDOMEN: Non-acute. MUSCULOSKELETAL: Non-acute. Review of the MIP images confirms the above findings. IMPRESSION: 1. No acute pulmonary embolism. 2. Borderline cardiomegaly.  No acute pulmonary process. 3. LEFT thyromegaly. Electronically Signed   By: Awilda Metroourtnay  Bloomer M.D.   On: 03/07/2018 01:48    Procedures Procedures (including critical care time)  Medications Ordered in ED Medications  sodium chloride flush (NS) 0.9 % injection 3 mL (3 mLs Intravenous Given 03/06/18 2322)  iopamidol (ISOVUE-370) 76 % injection 100 mL (100 mLs Intravenous Contrast Given 03/07/18 0105)     Initial Impression / Assessment and Plan / ED Course  I have reviewed the triage vital signs and the nursing notes.  Pertinent labs & imaging results that were available during my care of the patient were reviewed by me and considered in my medical decision making (see chart for details).  33 year old female here with chest pain.  Has been feeling chest pain and shortness of breath for the past week, intermittent but no noted alleviating or exacerbating factors.  She is afebrile and nontoxic.  She is tachycardic with heart rate around 114 cm.  She is hypertensive-- has been out of her home meds for several months since she lost her insurance.  EKG sinus tachycardia.  Labs overall reassuring.  CXR performed-- some peribronchial thickening, trachea appears deviated to the right.  Discussed with patient-- she does report some issues swallowing, food gets stuck at times.  Has also noticed a "lump" on the left side of her neck a few months ago.  Will obtain CT soft tissue neck as well as CTA chest.  CTA negative for PE.  CT neck with left thryomegaly displacing trachea to right but airway remains patient-- recommended follow-up  ultrasound.  Patient rechecked, her tachycardia has somewhat improved.  She remains without any respiratory distress, O2 sat stable on room air.  BP is elevated but no evidence of endorgan damage.  Results discussed with patient and her mother who is now at the bedside.  Apparently, patient has been having issues with thyroid nodule since 2009.  This was the last time she had this evaluated via US.  States she has been getting routine TSH checks until recently when she lost her insurance.  Patient will need close follow-up, will refer to patient care center and will message case management to follow-up on this.  Will re-start her BP meds.  Patient also with significant stress at home.  States she feels like she needs something to calm her so she can rest.  No SI/HI/AVH.  Will try some vistaril.    Patient and mother both understand importance of close follow-up, they will call this morning to schedule appointment.  Return precautions given for any new or worsening symptoms.  Final Clinical Impressions(s) / ED Diagnoses   Final diagnoses:  Chest pain in adult  Thyromegaly  Essential hypertension    ED Discharge Orders         Ordered    amLODipine (NORVASC) 5 MG tablet  Daily     03/07/18 0302    hydrochlorothiazide (HYDRODIURIL) 25 MG tablet  Daily     03/07/18 0302    hydrOXYzine (ATARAX/VISTARIL) 25 MG tablet  3 times daily PRN     03/07/18 0302           Garlon HatchetSanders, Clotiel Troop M, PA-C 03/07/18 [id number]    Marily MemosMesner, Jason, MD 03/08/18 0028

## 2018-03-06 NOTE — ED Notes (Signed)
EKG given to EDP,Allen,MD., for review. 

## 2018-03-06 NOTE — ED Notes (Signed)
Pt ambulatory to restroom

## 2018-03-06 NOTE — ED Triage Notes (Signed)
Pt reports L sided chest pain and SOB that started earlier this week. She reports nausea as well. Pt does not appear to be in any distress. A&Ox4.

## 2018-03-07 ENCOUNTER — Emergency Department (HOSPITAL_COMMUNITY): Payer: Self-pay

## 2018-03-07 ENCOUNTER — Encounter (HOSPITAL_COMMUNITY): Payer: Self-pay

## 2018-03-07 LAB — TSH: TSH: 1.509 u[IU]/mL (ref 0.350–4.500)

## 2018-03-07 MED ORDER — IOPAMIDOL (ISOVUE-370) INJECTION 76%
INTRAVENOUS | Status: AC
  Filled 2018-03-07: qty 100

## 2018-03-07 MED ORDER — SODIUM CHLORIDE (PF) 0.9 % IJ SOLN
INTRAMUSCULAR | Status: AC
  Filled 2018-03-07: qty 50

## 2018-03-07 MED ORDER — HYDROXYZINE HCL 25 MG PO TABS: 25.0000 mg | ORAL_TABLET | Freq: Three times a day (TID) | ORAL | 0 refills | Status: DC | PRN

## 2018-03-07 MED ORDER — HYDROCHLOROTHIAZIDE 25 MG PO TABS: 25.0000 mg | ORAL_TABLET | Freq: Every day | ORAL | 0 refills | Status: DC

## 2018-03-07 MED ORDER — AMLODIPINE BESYLATE 5 MG PO TABS: 5.0000 mg | ORAL_TABLET | Freq: Every day | ORAL | 0 refills | Status: DC

## 2018-03-07 MED ORDER — IOPAMIDOL (ISOVUE-370) INJECTION 76%
100.0000 mL | Freq: Once | INTRAVENOUS | Status: AC | PRN
  Administered 2018-03-07: 100 mL via INTRAVENOUS

## 2018-03-07 NOTE — Discharge Instructions (Signed)
Take the prescribed medication as directed. Follow-up with the patient care center--- call this morning for appt.  I will have our case manager follow-up on this as well. Return to the ED for new or worsening symptoms.

## 2018-03-07 NOTE — ED Notes (Signed)
Pt transported to CT ?

## 2018-03-08 LAB — T4: T4, Total: 5.6 ug/dL (ref 4.5–12.0)

## 2018-03-16 ENCOUNTER — Ambulatory Visit (INDEPENDENT_AMBULATORY_CARE_PROVIDER_SITE_OTHER): Payer: Self-pay | Admitting: Family Medicine

## 2018-03-16 ENCOUNTER — Encounter: Payer: Self-pay | Admitting: Family Medicine

## 2018-03-16 VITALS — BP 142/86 | HR 80 | Temp 97.8°F | Ht 65.0 in | Wt 337.0 lb

## 2018-03-16 DIAGNOSIS — F419 Anxiety disorder, unspecified: Secondary | ICD-10-CM

## 2018-03-16 DIAGNOSIS — E66813 Obesity, class 3: Secondary | ICD-10-CM

## 2018-03-16 DIAGNOSIS — Z0000 Encounter for general adult medical examination without abnormal findings: Secondary | ICD-10-CM

## 2018-03-16 DIAGNOSIS — Z6841 Body Mass Index (BMI) 40.0 and over, adult: Secondary | ICD-10-CM

## 2018-03-16 DIAGNOSIS — Z131 Encounter for screening for diabetes mellitus: Secondary | ICD-10-CM

## 2018-03-16 DIAGNOSIS — Z09 Encounter for follow-up examination after completed treatment for conditions other than malignant neoplasm: Secondary | ICD-10-CM

## 2018-03-16 LAB — POCT URINALYSIS DIP (MANUAL ENTRY)
Bilirubin, UA: NEGATIVE
Glucose, UA: NEGATIVE mg/dL
Ketones, POC UA: NEGATIVE mg/dL
Nitrite, UA: NEGATIVE
Protein Ur, POC: NEGATIVE mg/dL
Spec Grav, UA: 1.02 (ref 1.010–1.025)
Urobilinogen, UA: 0.2 E.U./dL
pH, UA: 6 (ref 5.0–8.0)

## 2018-03-16 LAB — POCT GLYCOSYLATED HEMOGLOBIN (HGB A1C): Hemoglobin A1C: 5.4 % (ref 4.0–5.6)

## 2018-03-16 MED ORDER — BUSPIRONE HCL 5 MG PO TABS: 5.0000 mg | ORAL_TABLET | Freq: Two times a day (BID) | ORAL | 1 refills | Status: DC

## 2018-03-16 NOTE — Patient Instructions (Signed)
Buspirone tablets What is this medicine? BUSPIRONE (byoo SPYE rone) is used to treat anxiety disorders. This medicine may be used for other purposes; ask your health care provider or pharmacist if you have questions. COMMON BRAND NAME(S): BuSpar What should I tell my health care provider before I take this medicine? They need to know if you have any of these conditions: -kidney or liver disease -an unusual or allergic reaction to buspirone, other medicines, foods, dyes, or preservatives -pregnant or trying to get pregnant -breast-feeding How should I use this medicine? Take this medicine by mouth with a glass of water. Follow the directions on the prescription label. You may take this medicine with or without food. To ensure that this medicine always works the same way for you, you should take it either always with or always without food. Take your doses at regular intervals. Do not take your medicine more often than directed. Do not stop taking except on the advice of your doctor or health care professional. Talk to your pediatrician regarding the use of this medicine in children. Special care may be needed. Overdosage: If you think you have taken too much of this medicine contact a poison control center or emergency room at once. NOTE: This medicine is only for you. Do not share this medicine with others. What if I miss a dose? If you miss a dose, take it as soon as you can. If it is almost time for your next dose, take only that dose. Do not take double or extra doses. What may interact with this medicine? Do not take this medicine with any of the following medications: -linezolid -MAOIs like Carbex, Eldepryl, Marplan, Nardil, and Parnate -methylene blue -procarbazine This medicine may also interact with the following medications: -diazepam -digoxin -diltiazem -erythromycin -grapefruit juice -haloperidol -medicines for mental depression or mood problems -medicines for seizures like  carbamazepine, phenobarbital and phenytoin -nefazodone -other medications for anxiety -rifampin -ritonavir -some antifungal medicines like itraconazole, ketoconazole, and voriconazole -verapamil -warfarin This list may not describe all possible interactions. Give your health care provider a list of all the medicines, herbs, non-prescription drugs, or dietary supplements you use. Also tell them if you smoke, drink alcohol, or use illegal drugs. Some items may interact with your medicine. What should I watch for while using this medicine? Visit your doctor or health care professional for regular checks on your progress. It may take 1 to 2 weeks before your anxiety gets better. You may get drowsy or dizzy. Do not drive, use machinery, or do anything that needs mental alertness until you know how this drug affects you. Do not stand or sit up quickly, especially if you are an older patient. This reduces the risk of dizzy or fainting spells. Alcohol can make you more drowsy and dizzy. Avoid alcoholic drinks. What side effects may I notice from receiving this medicine? Side effects that you should report to your doctor or health care professional as soon as possible: -blurred vision or other vision changes -chest pain -confusion -difficulty breathing -feelings of hostility or anger -muscle aches and pains -numbness or tingling in hands or feet -ringing in the ears -skin rash and itching -vomiting -weakness Side effects that usually do not require medical attention (report to your doctor or health care professional if they continue or are bothersome): -disturbed dreams, nightmares -headache -nausea -restlessness or nervousness -sore throat and nasal congestion -stomach upset This list may not describe all possible side effects. Call your doctor for medical advice about side   effects. You may report side effects to FDA at 1-800-FDA-1088. Where should I keep my medicine? Keep out of the reach  of children. Store at room temperature below 30 degrees C (86 degrees F). Protect from light. Keep container tightly closed. Throw away any unused medicine after the expiration date. NOTE: This sheet is a summary. It may not cover all possible information. If you have questions about this medicine, talk to your doctor, pharmacist, or health care provider.  2019 Elsevier/Gold Standard (2009-08-29 18:06:11)  

## 2018-03-16 NOTE — Progress Notes (Signed)
[facility]  New Patient--Hospital Follow Up  Subjective:  Patient ID: Anne Shaffer, female    DOB: 07/12/1985  Age: 33 y.o. MRN: [id number]  CC:  Chief Complaint  Patient presents with  . Establish Care  . Hospitalization Follow-up    HPI Anne Shaffer is a 33 year old female who presents for Hospital follow up and to Establish Care today.   Past Medical History:  Diagnosis Date  . Anxiety   . Depression   . Migraines   . Salivary calculus    Current Status: Since her last office visit, she is doing well with no complaints. She denies fevers, chills, fatigue, recent infections, weight loss, and night sweats. She has not had any headaches, visual changes, dizziness, and falls. No chest pain, heart palpitations, cough and shortness of breath reported. No reports of GI problems such as nausea, vomiting, diarrhea, and constipation. She has no reports of blood in stools, dysuria and hematuria. No depression or anxiety, and denies suicidal ideations, homicidal ideations, or auditory hallucinations. She denies pain today. She is having her menstrual period today.Her anxiety is moderate today, and she states that anxiety is  r/t work and family.  Past Surgical History:  Procedure Laterality Date  . NO PAST SURGERIES      Family History  Problem Relation Age of Onset  . Hyperlipidemia Maternal Grandmother   . Diabetes Maternal Grandmother   . Cancer Maternal Grandfather        prostate  . Hyperlipidemia Maternal Grandfather   . Early death Neg Hx   . Stroke Neg Hx     Social History   Socioeconomic History  . Marital status: Married    Spouse name: Not on file  . Number of children: Not on file  . Years of education: Not on file  . Highest education level: Not on file  Occupational History  . Not on file  Social Needs  . Financial resource strain: Not on file  . Food insecurity:    Worry: Not on file    Inability:  Not on file  . Transportation needs:    Medical: Not on file    Non-medical: Not on file  Tobacco Use  . Smoking status: Former Games developermoker  . Smokeless tobacco: Never Used  Substance and Sexual Activity  . Alcohol use: Yes    Comment: social  . Drug use: No  . Sexual activity: Yes    Birth control/protection: None  Lifestyle  . Physical activity:    Days per week: Not on file    Minutes per session: Not on file  . Stress: Not on file  Relationships  . Social connections:    Talks on phone: Not on file    Gets together: Not on file    Attends religious service: Not on file    Active member of club or organization: Not on file    Attends meetings of clubs or organizations: Not on file    Relationship status: Not on file  . Intimate partner violence:    Fear of current or ex partner: Not on file    Emotionally abused: Not on file    Physically abused: Not on file    Forced sexual activity: Not on file  Other Topics Concern  . Not on file  Social History Narrative   ** Merged History Encounter **        Outpatient Medications Prior to Visit  Medication Sig Dispense Refill  . amLODipine (NORVASC) 5 MG tablet Take 1 tablet (5 mg total) by mouth daily. 30 tablet 0  . hydrochlorothiazide (HYDRODIURIL) 25 MG tablet Take 1 tablet (25 mg total) by mouth daily. 30 tablet 0  . hydrOXYzine (ATARAX/VISTARIL) 25 MG tablet Take 1 tablet (25 mg total) by mouth 3 (three) times daily as needed. 20 tablet 0  . acetaminophen-codeine (TYLENOL #3) 300-30 MG per tablet Take 1-2 tablets by mouth every 6 (six) hours as needed for moderate pain. (Patient not taking: Reported on 12/12/2014) 15 tablet 0  . amoxicillin (AMOXIL) 500 MG capsule Take 1 capsule (500 mg total) by mouth 3 (three) times daily. 30 capsule 0  . cyclobenzaprine (FLEXERIL) 5 MG tablet Take 1 tablet (5 mg total) by mouth 3 (three) times daily as needed for muscle spasms. 15 tablet 0  . fluticasone (FLONASE) 50 MCG/ACT nasal spray  Place 1 spray into both nostrils daily.    Marland Kitchen guaiFENesin-codeine 100-10 MG/5ML syrup Take 5-10 mLs by mouth every 6 (six) hours as needed for cough. 120 mL 0  . HYDROcodone-homatropine (HYCODAN) 5-1.5 MG/5ML syrup Take 5 mLs by mouth every 8 (eight) hours as needed for cough. (Patient not taking: Reported on 12/12/2014) 120 mL 0  . ibuprofen (ADVIL,MOTRIN) 200 MG tablet Take 800 mg by mouth every 6 (six) hours as needed.    . naproxen (NAPROSYN) 500 MG tablet Take 1 tablet (500 mg total) by mouth 2 (two) times daily. 30 tablet 0   No facility-administered medications prior to visit.     Allergies  Allergen Reactions  . Shellfish Allergy   . Shellfish Allergy     ROS Review of Systems  Constitutional: Negative.   HENT: Negative.   Eyes: Negative.   Respiratory: Negative.   Cardiovascular: Negative.   Gastrointestinal: Negative.   Endocrine: Negative.   Genitourinary: Negative.   Musculoskeletal: Negative.   Skin: Negative.   Allergic/Immunologic: Negative.   Neurological: Negative.   Hematological: Negative.   Psychiatric/Behavioral: The patient is nervous/anxious.    Objective:    Physical Exam  Constitutional: She is oriented to person, place, and time. She appears well-developed and well-nourished.  HENT:  Head: Normocephalic and atraumatic.  Eyes: Conjunctivae are normal.  Neck: Normal range of motion. Neck supple.  Cardiovascular: Normal rate, regular rhythm, normal heart sounds and intact distal pulses.  Pulmonary/Chest: Effort normal and breath sounds normal.  Abdominal: Soft. Bowel sounds are normal.  Musculoskeletal: Normal range of motion.  Neurological: She is alert and oriented to person, place, and time. She has normal reflexes. A cranial nerve deficit is present.  Skin: Skin is warm and dry.  Psychiatric:  Anxiety is moderate today.   Nursing note and vitals reviewed.  BP (!) 142/86   Pulse 80   Temp 97.8 F (36.6 C) (Oral)   Ht 5\' 5"  (1.651 m)   Wt  (!) 337 lb (152.9 kg)   LMP 03/12/2018   SpO2 100%   BMI 56.08 kg/m  Wt Readings from Last 3 Encounters:  03/16/18 (!) 337 lb (152.9 kg)  10/18/15 (!) 335 lb (152 kg)  09/11/14 (!) 321 lb (145.6 kg)   Health Maintenance Due  Topic Date Due  . HIV Screening  10/03/2000  . TETANUS/TDAP  10/03/2004  . PAP SMEAR-Modifier  10/04/2006  . INFLUENZA VACCINE  09/02/2017   There are no preventive care reminders to display for this patient.  Lab Results  Component Value Date   TSH 1.509 03/06/2018  Lab Results  Component Value Date   WBC 8.5 03/06/2018   HGB 10.9 (L) 03/06/2018   HCT 38.9 03/06/2018   MCV 76.4 (L) 03/06/2018   PLT 365 03/06/2018   Lab Results  Component Value Date   NA 140 03/16/2018   K 4.6 03/16/2018   CO2 23 03/16/2018   GLUCOSE 100 (H) 03/16/2018   BUN 8 03/16/2018   CREATININE 0.76 03/16/2018   BILITOT 0.3 03/16/2018   ALKPHOS 86 03/16/2018   AST 16 03/16/2018   ALT 16 03/16/2018   PROT 8.1 03/16/2018   ALBUMIN 4.6 03/16/2018   CALCIUM 9.9 03/16/2018   ANIONGAP 7 03/06/2018   GFR 133.32 12/02/2012   Lab Results  Component Value Date   CHOL 129 12/02/2012   Lab Results  Component Value Date   HDL 30.70 (L) 12/02/2012   Lab Results  Component Value Date   LDLCALC 84 12/02/2012   Lab Results  Component Value Date   TRIG 70.0 12/02/2012   Lab Results  Component Value Date   CHOLHDL 4 12/02/2012   Lab Results  Component Value Date   HGBA1C 5.4 03/16/2018   Assessment & Plan:   1. Anxiety Her anxiety is moderate today. We will initiate Buspar today.  - busPIRone (BUSPAR) 5 MG tablet; Take 1 tablet (5 mg total) by mouth 2 (two) times daily.  Dispense: 60 tablet; Refill: 1 - Ambulatory referral to Psychiatry  2. Class 3 severe obesity due to excess calories without serious comorbidity with body mass index (BMI) of 50.0 to 59.9 in adult [facility]) Body mass index is 56.08 kg/m. Goal BMI  is <30. Encouraged efforts to reduce weight  include engaging in physical activity as tolerated with goal of 150 minutes per week. Improve dietary choices and eat a meal regimen consistent with a Mediterranean or DASH diet. Reduce simple carbohydrates. Do not skip meals and eat healthy snacks throughout the day to avoid over-eating at dinner. Set a goal weight loss that is achievable for you.  3. Screening for diabetes mellitus Hgb A1c is stable today at 5.4 today. She will continue to decrease foods/beverages high in sugars and carbs and follow Heart Healthy or DASH diet. Increase physical activity to at least 30 minutes cardio exercise daily.  - POCT glycosylated hemoglobin (Hb A1C) - POCT urinalysis dipstick  4. Healthcare maintenance - Comprehensive metabolic panel; Future - Vitamin D, 25-hydroxy - Vitamin B12 - Comprehensive metabolic panel  5. Follow up She will follow up in 6 weeks.   Meds ordered this encounter  Medications  . busPIRone (BUSPAR) 5 MG tablet    Sig: Take 1 tablet (5 mg total) by mouth 2 (two) times daily.    Dispense:  60 tablet    Refill:  1   Orders Placed This Encounter  Procedures  . Comprehensive metabolic panel  . Vitamin D, 25-hydroxy  . Vitamin B12  . Ambulatory referral to Psychiatry  . POCT glycosylated hemoglobin (Hb A1C)  . POCT urinalysis dipstick    Referral Orders     Ambulatory referral to Psychiatry  Raliegh Ip,  MSN, FNP-C [facility] [address] [place], [place] [address] [phone]    Problem List Items Addressed This Visit    None    Visit Diagnoses    Anxiety    -  Primary   Relevant Medications   busPIRone (BUSPAR) 5 MG tablet   Other Relevant Orders   Ambulatory referral to Psychiatry  Class 3 severe obesity due to excess calories without serious comorbidity with body mass index (BMI) of 50.0 to 59.9 in adult [facility])       Screening for diabetes mellitus       Relevant Orders   POCT glycosylated hemoglobin (Hb A1C)  (Completed)   POCT urinalysis dipstick (Completed)   Healthcare maintenance       Relevant Orders   Comprehensive metabolic panel (Completed)   Vitamin D, 25-hydroxy (Completed)   Vitamin B12 (Completed)      Meds ordered this encounter  Medications  . busPIRone (BUSPAR) 5 MG tablet    Sig: Take 1 tablet (5 mg total) by mouth 2 (two) times daily.    Dispense:  60 tablet    Refill:  1    Follow-up: Return in about 6 weeks (around 04/27/2018).    Kallie Locks, FNP

## 2018-03-17 LAB — COMPREHENSIVE METABOLIC PANEL
ALT: 16 IU/L (ref 0–32)
AST: 16 IU/L (ref 0–40)
Albumin/Globulin Ratio: 1.3 (ref 1.2–2.2)
Albumin: 4.6 g/dL (ref 3.8–4.8)
Alkaline Phosphatase: 86 IU/L (ref 39–117)
BUN/Creatinine Ratio: 11 (ref 9–23)
BUN: 8 mg/dL (ref 6–20)
Bilirubin Total: 0.3 mg/dL (ref 0.0–1.2)
CO2: 23 mmol/L (ref 20–29)
Calcium: 9.9 mg/dL (ref 8.7–10.2)
Chloride: 98 mmol/L (ref 96–106)
Creatinine, Ser: 0.76 mg/dL (ref 0.57–1.00)
GFR calc Af Amer: 120 mL/min/{1.73_m2} (ref >59)
GFR calc non Af Amer: 104 mL/min/{1.73_m2} (ref >59)
Globulin, Total: 3.5 g/dL (ref 1.5–4.5)
Glucose: 100 mg/dL — ABNORMAL HIGH (ref 65–99)
Potassium: 4.6 mmol/L (ref 3.5–5.2)
Sodium: 140 mmol/L (ref 134–144)
Total Protein: 8.1 g/dL (ref 6.0–8.5)

## 2018-03-17 LAB — VITAMIN D 25 HYDROXY (VIT D DEFICIENCY, FRACTURES): Vit D, 25-Hydroxy: 9.4 ng/mL — ABNORMAL LOW (ref 30.0–100.0)

## 2018-03-17 LAB — VITAMIN B12: Vitamin B-12: 494 pg/mL (ref 232–1245)

## 2018-03-18 DIAGNOSIS — Z6841 Body Mass Index (BMI) 40.0 and over, adult: Secondary | ICD-10-CM

## 2018-03-18 DIAGNOSIS — F419 Anxiety disorder, unspecified: Secondary | ICD-10-CM | POA: Insufficient documentation

## 2018-03-18 DIAGNOSIS — E66813 Obesity, class 3: Secondary | ICD-10-CM | POA: Insufficient documentation

## 2018-03-21 ENCOUNTER — Encounter: Payer: Self-pay | Admitting: Family Medicine

## 2018-03-21 ENCOUNTER — Other Ambulatory Visit: Payer: Self-pay | Admitting: Family Medicine

## 2018-03-21 DIAGNOSIS — E559 Vitamin D deficiency, unspecified: Secondary | ICD-10-CM

## 2018-03-21 MED ORDER — VITAMIN D (ERGOCALCIFEROL) 1.25 MG (50000 UNIT) PO CAPS: 50000.0000 [IU] | ORAL_CAPSULE | ORAL | 3 refills | Status: DC

## 2018-03-22 ENCOUNTER — Telehealth: Payer: Self-pay

## 2018-03-24 ENCOUNTER — Other Ambulatory Visit: Payer: Self-pay | Admitting: Family Medicine

## 2018-03-24 DIAGNOSIS — E010 Iodine-deficiency related diffuse (endemic) goiter: Secondary | ICD-10-CM

## 2018-03-24 DIAGNOSIS — J398 Other specified diseases of upper respiratory tract: Secondary | ICD-10-CM

## 2018-03-24 DIAGNOSIS — R0602 Shortness of breath: Secondary | ICD-10-CM

## 2018-03-24 NOTE — Progress Notes (Signed)
Patient has c/o left chest discomfort and shortness of breath, r/t Thyromegaly. We will refer her to general surgery today.

## 2018-03-25 NOTE — Telephone Encounter (Signed)
-----   Message from Kallie Locks, FNP sent at 03/24/2018  1:26 PM EST ----- Regarding: "Referral to General Surgery" Just faxed you Referral for General Surgery for Ms. Acre. Please inform patient after you get results, as she is very anxious to have surgery. Thank you.

## 2018-03-25 NOTE — Telephone Encounter (Signed)
Patient notified that we have sent referral to [facility] and also gave her the Telephone number to call them.

## 2018-03-28 ENCOUNTER — Ambulatory Visit (HOSPITAL_COMMUNITY): Payer: Self-pay | Admitting: Psychiatry

## 2018-03-30 NOTE — Telephone Encounter (Signed)
Patient given information and waiting to get scheduled

## 2018-03-30 NOTE — Telephone Encounter (Signed)
-----   Message from Natalie M Stroud, FNP sent at 03/24/2018  1:26 PM EST ----- Regarding: "Referral to General Surgery" Just faxed you Referral for General Surgery for Ms. Anne Shaffer. Please inform patient after you get results, as she is very anxious to have surgery. Thank you.   

## 2018-04-19 ENCOUNTER — Other Ambulatory Visit: Payer: Self-pay | Admitting: Surgery

## 2018-04-19 DIAGNOSIS — E041 Nontoxic single thyroid nodule: Secondary | ICD-10-CM

## 2018-04-20 ENCOUNTER — Telehealth: Payer: Self-pay

## 2018-04-20 ENCOUNTER — Other Ambulatory Visit: Payer: Self-pay | Admitting: Surgery

## 2018-04-20 ENCOUNTER — Other Ambulatory Visit: Payer: Self-pay | Admitting: Family Medicine

## 2018-04-20 ENCOUNTER — Encounter: Payer: Self-pay | Admitting: Family Medicine

## 2018-04-20 DIAGNOSIS — E041 Nontoxic single thyroid nodule: Secondary | ICD-10-CM

## 2018-04-20 DIAGNOSIS — I10 Essential (primary) hypertension: Secondary | ICD-10-CM | POA: Insufficient documentation

## 2018-04-20 MED ORDER — HYDROCHLOROTHIAZIDE 25 MG PO TABS: 25.0000 mg | ORAL_TABLET | Freq: Every day | ORAL | 3 refills | Status: DC

## 2018-04-21 ENCOUNTER — Other Ambulatory Visit: Payer: Self-pay

## 2018-04-21 DIAGNOSIS — I10 Essential (primary) hypertension: Secondary | ICD-10-CM

## 2018-04-21 MED ORDER — HYDROCHLOROTHIAZIDE 25 MG PO TABS: 25.0000 mg | ORAL_TABLET | Freq: Every day | ORAL | 3 refills | Status: DC

## 2018-04-21 NOTE — Telephone Encounter (Signed)
Patient has been notified

## 2018-04-21 NOTE — Telephone Encounter (Signed)
Patient notified and mediation sent to new pharmacy

## 2018-04-27 ENCOUNTER — Telehealth (INDEPENDENT_AMBULATORY_CARE_PROVIDER_SITE_OTHER): Payer: Self-pay | Admitting: Family Medicine

## 2018-04-27 ENCOUNTER — Other Ambulatory Visit: Payer: Self-pay

## 2018-04-27 DIAGNOSIS — E669 Obesity, unspecified: Secondary | ICD-10-CM

## 2018-04-27 DIAGNOSIS — F419 Anxiety disorder, unspecified: Secondary | ICD-10-CM

## 2018-04-27 MED ORDER — BUSPIRONE HCL 10 MG PO TABS: 10.0000 mg | ORAL_TABLET | Freq: Three times a day (TID) | ORAL | 3 refills | Status: DC

## 2018-04-27 NOTE — Progress Notes (Addendum)
Virtual Visit via Telephone Note  I connected with Anne Shaffer on 04/27/18 at 11:00 AM EDT by telephone and verified that I am speaking with the correct person using two identifiers.   I discussed the limitations, risks, security and privacy concerns of performing an evaluation and management service by telephone and the availability of in person appointments. I also discussed with the patient that there may be a patient responsible charge related to this service. The patient expressed understanding and agreed to proceed.   History of Present Illness:  Current Status: Since her last office visit, she is doing well with no complaints. She is scheduled for Thyroid biopsy in 05/2018. Her anxiety is stable today. She denies suicidal ideations, homicidal ideations, or auditory hallucinations.  She denies fevers, chills, fatigue, recent infections, weight loss, and night sweats. She has not had any headaches, visual changes, dizziness, and falls. No chest pain, heart palpitations, cough and shortness of breath reported. No reports of GI problems such as nausea, vomiting, diarrhea, and constipation. She has no reports of blood in stools, dysuria and hematuria. She denies pain today.   Observations/Objective: Telephone Virtual Visit.   Assessment and Plan:  1. Anxiety Buspar increased to 10 mg BID. - busPIRone (BUSPAR) 10 MG tablet; Take 1 tablet (10 mg total) by mouth 3 (three) times daily.  Dispense: 60 tablet; Refill: 3  2. Obese Goal BMI  is <30. Encouraged efforts to reduce weight include engaging in physical activity as tolerated with goal of 150 minutes per week. Improve dietary choices and eat a meal regimen consistent with a Mediterranean or DASH diet. Reduce simple carbohydrates. Do not skip meals and eat healthy snacks throughout the day to avoid over-eating at dinner. Set a goal weight loss that is achievable for you.  Follow Up Instructions: Follow up in 2 months.    I discussed  the assessment and treatment plan with the patient. The patient was provided an opportunity to ask questions and all were answered. The patient agreed with the plan and demonstrated an understanding of the instructions.   The patient was advised to call back or seek an in-person evaluation if the symptoms worsen or if the condition fails to improve as anticipated.  I provided 15-20 minutes of non-face-to-face time during this encounter.   Kallie Locks, FNP

## 2018-05-25 ENCOUNTER — Other Ambulatory Visit: Payer: Self-pay

## 2018-06-23 ENCOUNTER — Other Ambulatory Visit: Payer: Self-pay

## 2018-07-01 ENCOUNTER — Other Ambulatory Visit: Payer: Self-pay | Admitting: Surgery

## 2018-07-01 DIAGNOSIS — E041 Nontoxic single thyroid nodule: Secondary | ICD-10-CM

## 2018-07-06 ENCOUNTER — Ambulatory Visit
Admission: RE | Admit: 2018-07-06 | Discharge: 2018-07-06 | Disposition: A | Discharge status: Home / Self Care | Payer: Self-pay | Source: Ambulatory Visit | Attending: Surgery | Admitting: Surgery

## 2018-07-06 DIAGNOSIS — E041 Nontoxic single thyroid nodule: Secondary | ICD-10-CM

## 2018-07-08 ENCOUNTER — Telehealth: Payer: Self-pay | Admitting: Family Medicine

## 2018-07-08 ENCOUNTER — Other Ambulatory Visit: Payer: Self-pay | Admitting: Family Medicine

## 2018-07-08 DIAGNOSIS — Z3200 Encounter for pregnancy test, result unknown: Secondary | ICD-10-CM

## 2018-07-11 ENCOUNTER — Other Ambulatory Visit: Payer: Self-pay | Admitting: Surgery

## 2018-07-11 DIAGNOSIS — E041 Nontoxic single thyroid nodule: Secondary | ICD-10-CM

## 2018-07-11 NOTE — Telephone Encounter (Signed)
Sent to nurse

## 2018-07-28 ENCOUNTER — Ambulatory Visit: Payer: Self-pay | Admitting: Family Medicine

## 2018-08-02 ENCOUNTER — Ambulatory Visit: Payer: Self-pay | Admitting: Family Medicine

## 2019-01-11 ENCOUNTER — Other Ambulatory Visit: Payer: Self-pay | Admitting: Family Medicine

## 2019-01-11 DIAGNOSIS — F419 Anxiety disorder, unspecified: Secondary | ICD-10-CM

## 2019-08-12 ENCOUNTER — Other Ambulatory Visit: Payer: Self-pay | Admitting: Family Medicine

## 2019-08-12 DIAGNOSIS — E559 Vitamin D deficiency, unspecified: Secondary | ICD-10-CM

## 2019-08-12 DIAGNOSIS — I10 Essential (primary) hypertension: Secondary | ICD-10-CM

## 2019-12-12 IMAGING — US US THYROID
1 series · 13 of 25 positions shown · non-contrast
Comparison: Remote prior thyroid ultrasound 03/25/2007

CLINICAL DATA: Incidental on CT. 32-year-old female with left
thyroid enlargement on recent

EXAM:
THYROID ULTRASOUND
TECHNIQUE: Ultrasound examination of the thyroid gland and adjacent soft
tissues was performed.

[Series 1: us thyroid · 0.08mm/px · 13 of 57 slices shown]
[im 1/57]
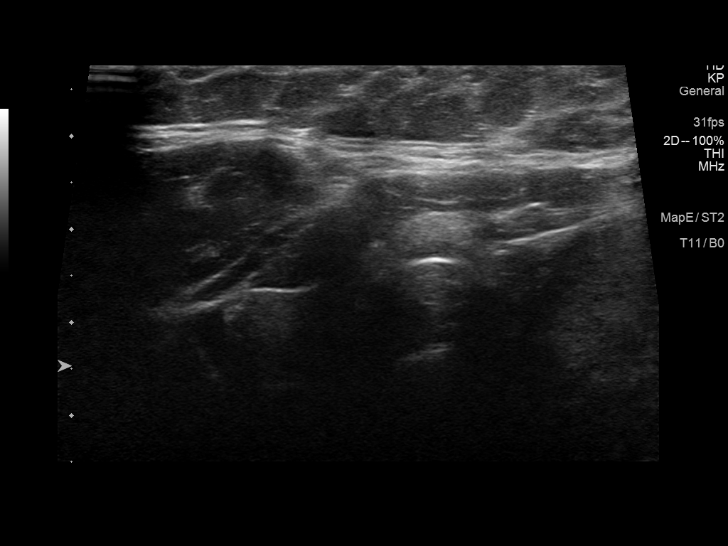
[im 5/57]
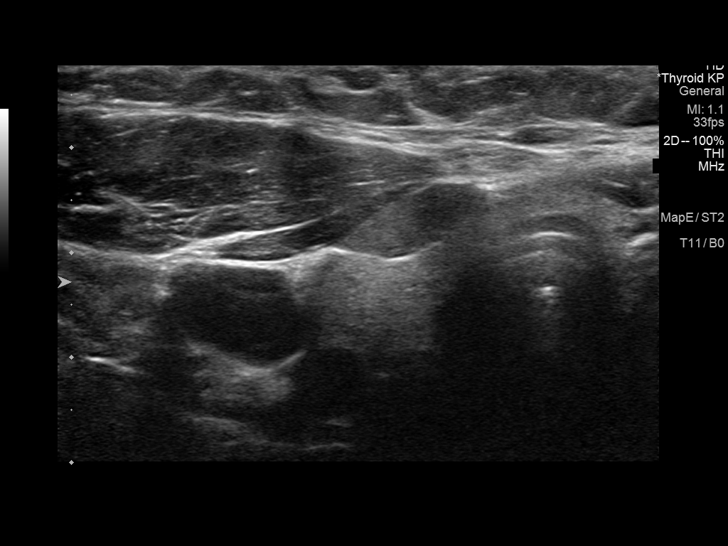
[im 10/57]
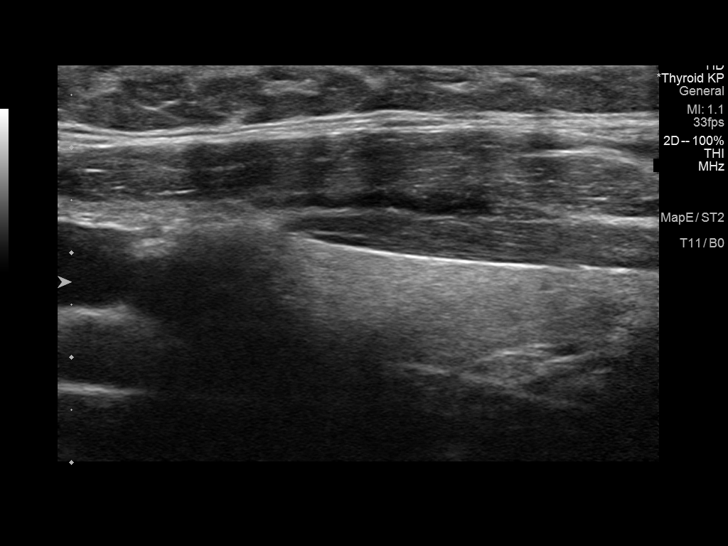
[im 15/57]
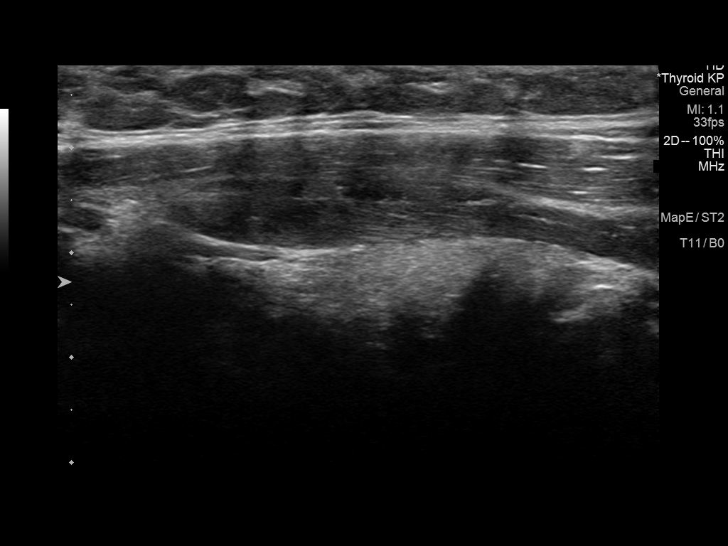
[im 19/57]
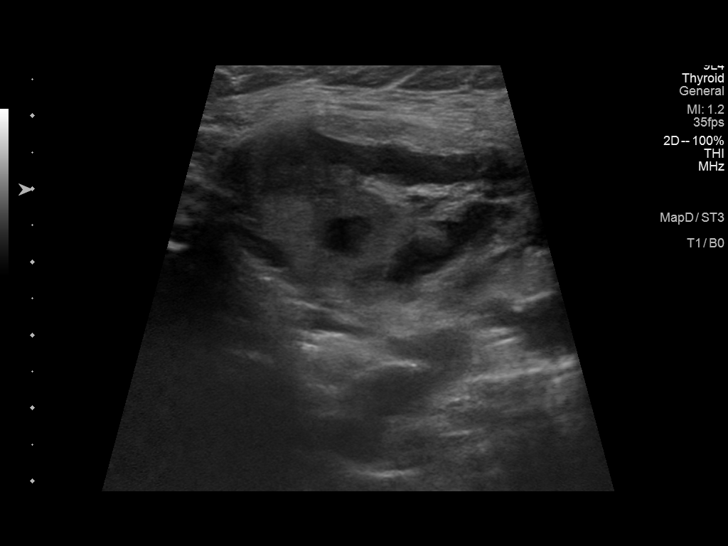
[im 24/57]
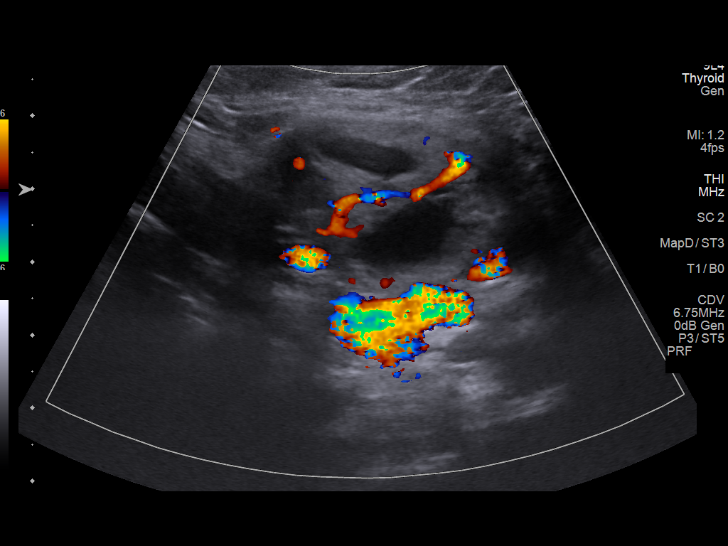
[im 29/57]
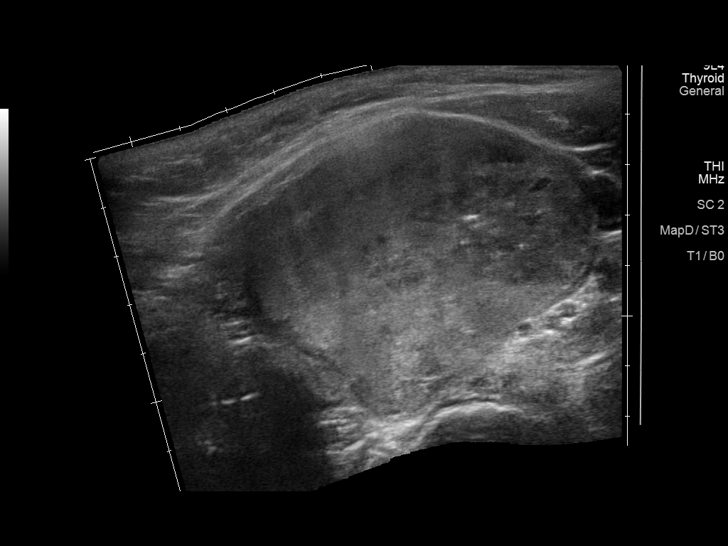
[im 33/57]
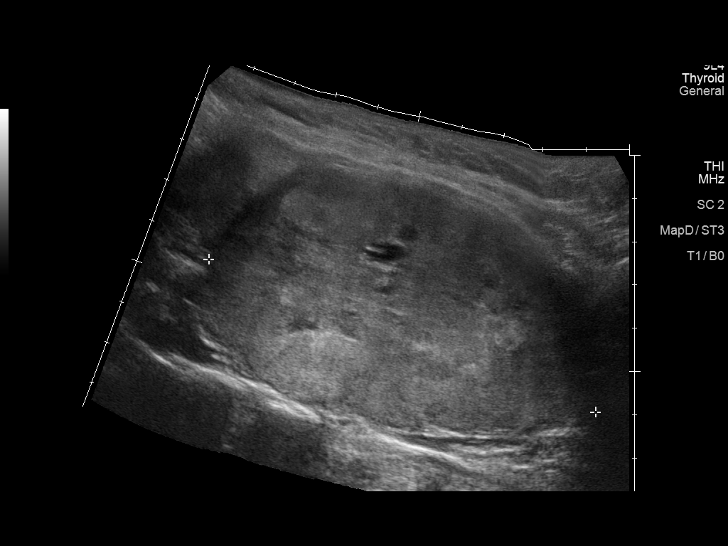
[im 38/57]
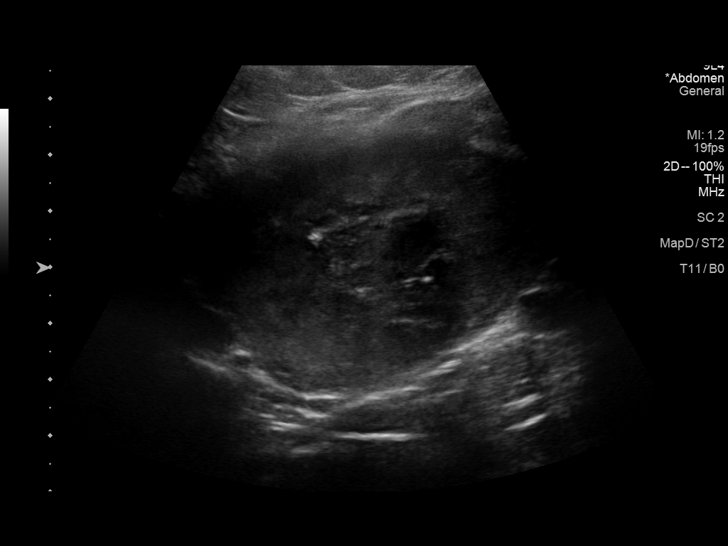
[im 43/57]
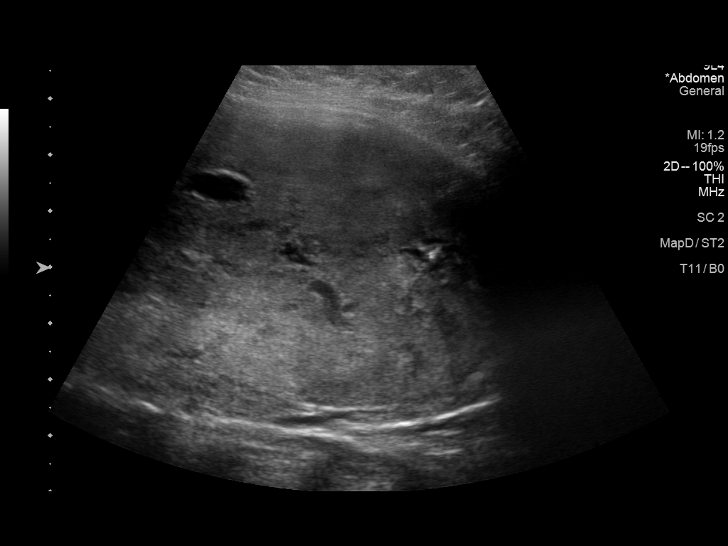
[im 47/57]
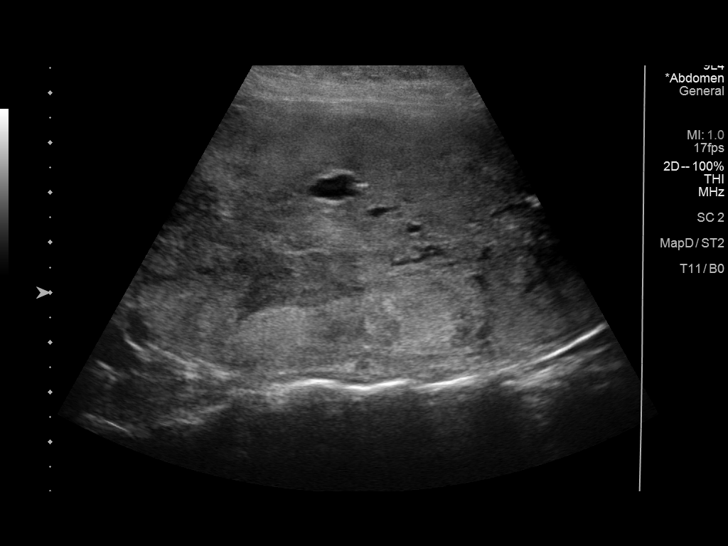
[im 52/57]
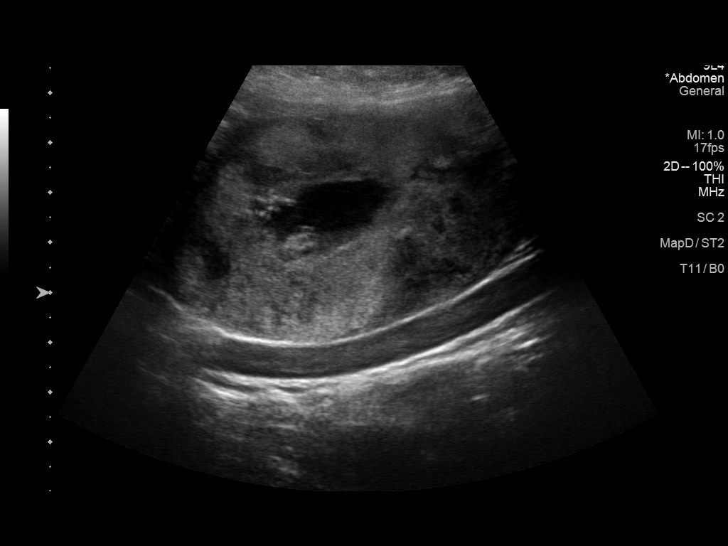
[im 57/57]
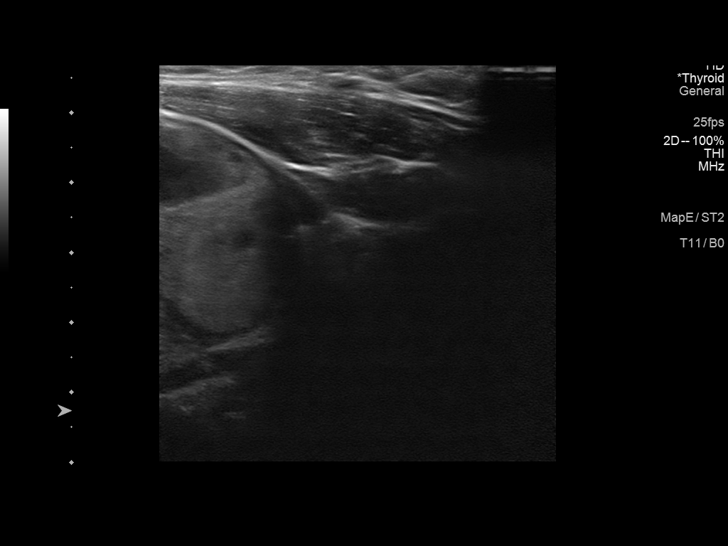

[13 of 25 positions shown; findings below may reference images not displayed]

FINDINGS: Parenchymal Echotexture: Mildly heterogenous

Isthmus: 0.3 cm

Right lobe: 4.6 x 1.1 x 1.4 cm

Left lobe: 11.5 x 5.9 x 6.8 cm

_________________________________________________________

Estimated total number of nodules >/= 1 cm: 2

Number of spongiform nodules >/=  2 cm not described below (TR1): 0

Number of mixed cystic and solid nodules >/= 1.5 cm not described
below (TR2): 0

_________________________________________________________

Nodule # 1:

Location: Left; Superior

Maximum size: 4.0 cm; Other 2 dimensions: 3.7 x 2.7 cm. Nodule
previously measured 1.7 x 1.7 x 1.3 cm.

Composition: solid/almost completely solid (2)

Echogenicity: isoechoic (1)

Shape: not taller-than-wide (0)

Margins: smooth (0)

Echogenic foci: none (0)

ACR TI-RADS total points: 3.

ACR TI-RADS risk category: TR3 (3 points).

ACR TI-RADS recommendations:

**Given size (>/= 2.5 cm) and appearance, fine needle aspiration of
this mildly suspicious nodule should be considered based on TI-RADS
criteria.

_________________________________________________________

Nodule # 2:

Location: Left; Inferior

Maximum size: 9.5 cm; Other 2 dimensions: 6.8 x 5.9 cm. This nodule
previously measured 3.0 x 2.3 x 1.7 cm.

Composition: solid/almost completely solid (2)

Echogenicity: isoechoic (1)

Shape: not taller-than-wide (0)

Margins: smooth (0)

Echogenic foci: none (0)

ACR TI-RADS total points: 3.

ACR TI-RADS risk category: TR3 (3 points).

ACR TI-RADS recommendations:

**Given size (>/= 2.5 cm) and appearance, fine needle aspiration of
this mildly suspicious nodule should be considered based on TI-RADS
criteria.

_________________________________________________________
IMPRESSION: The left thyroid gland is replaced by 2 large TI-RADS category 3
thyroid nodules which have substantially increased in size when
compared to prior imaging from 03/25/2007.

Both nodules meet current criteria for consideration of fine-needle
aspiration biopsy.

The above is in keeping with the ACR TI-RADS recommendations - [HOSPITAL] 9012;[DATE].

## 2020-05-29 ENCOUNTER — Other Ambulatory Visit: Payer: Self-pay

## 2020-05-29 DIAGNOSIS — I10 Essential (primary) hypertension: Secondary | ICD-10-CM

## 2020-05-31 ENCOUNTER — Ambulatory Visit (HOSPITAL_BASED_OUTPATIENT_CLINIC_OR_DEPARTMENT_OTHER): Payer: 59 | Admitting: Nurse Practitioner

## 2020-05-31 ENCOUNTER — Other Ambulatory Visit: Payer: Self-pay

## 2020-05-31 ENCOUNTER — Encounter (HOSPITAL_BASED_OUTPATIENT_CLINIC_OR_DEPARTMENT_OTHER): Payer: Self-pay | Admitting: Nurse Practitioner

## 2020-05-31 VITALS — BP 139/88 | HR 100 | Ht 65.0 in | Wt 358.8 lb

## 2020-05-31 DIAGNOSIS — I10 Essential (primary) hypertension: Secondary | ICD-10-CM

## 2020-05-31 DIAGNOSIS — Z6841 Body Mass Index (BMI) 40.0 and over, adult: Secondary | ICD-10-CM

## 2020-05-31 DIAGNOSIS — F419 Anxiety disorder, unspecified: Secondary | ICD-10-CM

## 2020-05-31 DIAGNOSIS — E041 Nontoxic single thyroid nodule: Secondary | ICD-10-CM | POA: Diagnosis not present

## 2020-05-31 DIAGNOSIS — Z7689 Persons encountering health services in other specified circumstances: Secondary | ICD-10-CM | POA: Diagnosis not present

## 2020-05-31 DIAGNOSIS — E66813 Obesity, class 3: Secondary | ICD-10-CM

## 2020-05-31 DIAGNOSIS — J4540 Moderate persistent asthma, uncomplicated: Secondary | ICD-10-CM

## 2020-05-31 DIAGNOSIS — U099 Post covid-19 condition, unspecified: Secondary | ICD-10-CM

## 2020-05-31 MED ORDER — HYDROCHLOROTHIAZIDE 25 MG PO TABS: 1.0000 | ORAL_TABLET | Freq: Every day | ORAL | 1 refills | Status: DC

## 2020-05-31 MED ORDER — AMLODIPINE BESYLATE 5 MG PO TABS: 5.0000 mg | ORAL_TABLET | Freq: Every day | ORAL | 1 refills | Status: DC

## 2020-05-31 MED ORDER — BUSPIRONE HCL 10 MG PO TABS: ORAL_TABLET | ORAL | 1 refills | Status: DC

## 2020-05-31 MED ORDER — HYDROXYZINE HCL 25 MG PO TABS: ORAL_TABLET | ORAL | 1 refills | Status: DC

## 2020-05-31 MED ORDER — ALBUTEROL SULFATE HFA 108 (90 BASE) MCG/ACT IN AERS: 1.0000 | INHALATION_SPRAY | RESPIRATORY_TRACT | 1 refills | Status: DC | PRN

## 2020-05-31 MED ORDER — MONTELUKAST SODIUM 10 MG PO TABS: 10.0000 mg | ORAL_TABLET | Freq: Every day | ORAL | 1 refills | Status: DC

## 2020-05-31 NOTE — Assessment & Plan Note (Signed)
Long standing history of HTN with no recent medications due to no PCP for management.  Will plan to restart amlodipine-hctz today as she has had success with this in the past.  Recommend at home BP monitoring and bringing results to f/u visit for evaluation. Will obtain baseline labs today for evaluation.  She does have mild LE edema with no signs of fluid overload present. Recommendations to elevate legs when seated and compression stockings for better control of edema while seated for work.  Plan to f/u in 2 weeks.

## 2020-05-31 NOTE — Assessment & Plan Note (Signed)
BMI today 59.71. Modell is eager to get her health back on track and start working towards a healthier lifestyle.  We will obtain labs today for evaluation and determine steps needed to help with weight loss and improved health.  She does have significant underlying mental health and emotional stressors right now that may impact her ability to commit to a weight loss regimen, but we will work on slow changes to overall improve her health and quality of life.

## 2020-05-31 NOTE — Progress Notes (Signed)
New Patient Office Visit  Subjective:  Patient ID: Anne Shaffer, female    DOB: Jul 02, 1985  Age: 35 y.o. MRN: [id number]  CC:  Chief Complaint  Patient presents with  . Establish Care  . Hypertension    HPI Anne Shaffer is a 35 year old female who presents today to establish care with this practice.  Her previous PCP was: Dr. Bradly Chris Her last CPE was: "a while ago" She is also seen by: Urgent Care and Express [facility] providers over the past several years.   Concerns today: FMLA- paperwork received from employer for start date 05/24/2020. Patient reports that she has been under a tremendous amount of stress lately with the recent sudden illness of her grandmother and prolonged illness with cancer in her grandfathers. She states that this has taken a significant impact on her trying to care for them and work at the same time. She tells me that she had to take a leave from work starting 05/24/2020 in order to help care for her ailing grandparents and she is very concerned about the impact this will have on her job. She is currently working for [employer] from home and states that she really enjoys her job.   She reports that previously she was on daily medication for her anxiety, but she left her last PCP practice and has not been on the medication in several months. She reports that increased stress recently has significantly impacted her life and she feels that she needs to get back on her medication in order to return to her baseline level of functioning.   She also tells me that she has been dealing with issues of shortness of breath at rest and increased fatigue since having COVID earlier this year. She endorses significant anxiety over this as she lost a close family member to the virus and she is very worried about her health and the prolonged symptoms she has been having. She endorses wheezing, dry cough, shortness of breath at rest and on exertion, rhinorrhea, and sinus pressure. She also  feels that her symptoms have been worsened recently with the increased pollen in the air. She denies any fevers, chills, productive cough, body aches, loss of smell, or loss of taste.   She endorses a long history of hypertension for which she was taking medication, but ran out after leaving her last PCP. She would like to restart this medication today as she has noticed that her blood pressure has been elevated recently. She denies chest pain, palpitations, headaches, weakness, or dizziness.   She tells me that she has a history of thyroid disorder, but she is unsure what was wrong. She reports her last PCP told her she needed to have surgery on the thyroid, but she has heard from friends and family members that medication can be used to treat thyroid issues. She would like to discuss this today.   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  She endorses that she feels safe in her current relationship and home.  She is currently helping to care for her grandparents who are both elderly and ill at this time. Her grandfather is battling cancer and her grandmother was recently released from the hospital after emergency surgery and she has baseline dementia.  She is very stressed about the current situation. She does not smoke, use recreational drugs, or drink alcohol.  She is currently sexually active with her husband and  no other partners. She has no concerns for STI today and declines testing.  Her LMP was 04/25. She endorses regular menstrual cycles with normal flow. Historically she had to utilize progesterone treatment due to prolonged menses and anemia from blood loss, but this has resolved.  She is unsure of her vaccination history and her last pap smear.   PHQ9 SCORE ONLY 05/31/2020 03/16/2018 12/02/2012  PHQ-9 Total Score 10 1 0   GAD 7 : Generalized Anxiety Score 05/31/2020  Nervous, Anxious, on Edge 1  Control/stop worrying 1   Worry too much - different things 2  Trouble relaxing 2  Restless 0  Easily annoyed or irritable 1  Afraid - awful might happen 0  Total GAD 7 Score 7  Anxiety Difficulty Somewhat difficult    ROS Review of Systems All review of systems negative except what is listed in the HPI  Objective:   Today's Vitals: BP 139/88   Pulse 100   Ht 5\' 5"  (1.651 m)   Wt (!) 358 lb 12.8 oz (162.8 kg)   SpO2 100%   BMI 59.71 kg/m   Physical Exam Vitals and nursing note reviewed.  Constitutional:      Appearance: Normal appearance. She is obese.  HENT:     Head: Normocephalic.     Mouth/Throat:     Mouth: Mucous membranes are moist.     Pharynx: Oropharynx is clear.  Eyes:     Extraocular Movements: Extraocular movements intact.     Conjunctiva/sclera: Conjunctivae normal.     Pupils: Pupils are equal, round, and reactive to light.  Neck:     Vascular: No carotid bruit.  Cardiovascular:     Rate and Rhythm: Regular rhythm. Tachycardia present.     Pulses: Normal pulses.     Heart sounds: Normal heart sounds. No murmur heard.   Pulmonary:     Effort: Pulmonary effort is normal.     Breath sounds: Wheezing present. No rhonchi.  Abdominal:     General: Bowel sounds are normal. There is no distension.     Palpations: Abdomen is soft.     Tenderness: There is no abdominal tenderness. There is no right CVA tenderness or left CVA tenderness.  Musculoskeletal:        General: Normal range of motion.     Cervical back: Normal range of motion. No rigidity or tenderness.     Right lower leg: Edema present.     Left lower leg: Edema present.  Lymphadenopathy:     Cervical: No cervical adenopathy.  Skin:    General: Skin is warm and dry.     Capillary Refill: Capillary refill takes less than 2 seconds.  Neurological:     General: No focal deficit present.     Mental Status: She is alert and oriented to person, place, and time.     Cranial Nerves: No cranial nerve deficit.      Sensory: No sensory deficit.     Motor: No weakness.     Coordination: Coordination normal.     Gait: Gait normal.  Psychiatric:        Mood and Affect: Mood normal.        Behavior: Behavior normal.        Thought Content: Thought content normal.        Judgment: Judgment normal.     Assessment & Plan:   Problem List Items Addressed This Visit      Cardiovascular and Mediastinum   Essential hypertension  Long standing history of HTN with no recent medications due to no PCP for management.  Will plan to restart amlodipine-hctz today as she has had success with this in the past.  Recommend at home BP monitoring and bringing results to f/u visit for evaluation. Will obtain baseline labs today for evaluation.  She does have mild LE edema with no signs of fluid overload present. Recommendations to elevate legs when seated and compression stockings for better control of edema while seated for work.  Plan to f/u in 2 weeks.       Relevant Medications   hydrochlorothiazide (HYDRODIURIL) 25 MG tablet   amLODipine (NORVASC) 5 MG tablet     Respiratory   Moderate persistent asthma    Symptoms and presentation consistent with asthma. It is unclear if this is resultant of recent COVID infection- she does endorse symptoms prior to her illness, but worsening after.  We will start treatment today with PRN albuterol and daily montelukast to help with symptom management and improved breathing. Wheezes are present, but no signs of acute respiratory distress or fluid overload.  Plan to f/u in 2 weeks.       Relevant Medications   albuterol (VENTOLIN HFA) 108 (90 Base) MCG/ACT inhaler   montelukast (SINGULAIR) 10 MG tablet     Endocrine   THYROID NODULE    Review of chart reveals a history of two thyroid nodules with growth detected on serial ultrasound evaluation. It appears that recommendation from previous providers included fine needle biopsy given the size and appearance of the nodules.   Discussed with patient my recommendation for biopsy given the results from previous studies. Discussed the procedure with the patient and reasons that surveillance is important.  She agrees to follow-up with the endocrinologist she has seen in the past for biopsy.  She will let me know if she needs formal referral for this.  Will monitor labs today.         Other   Anxiety    Exacerbation of anxiety symptoms with recent illness and concerns over the effect this may have on her health in addition to caring for both of her grandparents with serious health issues and chronic health needs.  She has taken off of work starting 04/22 to help care for her grandparents and for her own mental health state. She is supposed to return to work on Monday, but at this time I feel that it is best to have her stay out of work for an additional 2 weeks until we can get her back on medication and her mood improved before adding the stress of work back on her.  She does have signs of depression and anxiety present with positive PHQ9 and GAD7 scores today.  Discussed with patient the importance of self care and continuance of medications to help with her mood.  FMLA paperwork has been sent to my office- will evaluate and fill out forms based on evaluation today, however, did discuss with patient that I am unable to retroactively begin her leave as I had not seen her under my care until today. She expressed understanding of this.  Will plan to follow-up in 2 weeks to re-evaluate mood and overall health. At this time return to work expected 06/17/2020.      Relevant Medications   busPIRone (BUSPAR) 10 MG tablet   hydrOXYzine (ATARAX/VISTARIL) 25 MG tablet   Class 3 severe obesity due to excess calories without serious comorbidity with body mass index (BMI) of 50.0  to 59.9 in adult [facility])    BMI today 59.71. Madalynn is eager to get her health back on track and start working towards a healthier lifestyle.  We will  obtain labs today for evaluation and determine steps needed to help with weight loss and improved health.  She does have significant underlying mental health and emotional stressors right now that may impact her ability to commit to a weight loss regimen, but we will work on slow changes to overall improve her health and quality of life.       Encounter to establish care - Primary    New patient to establish care today.  Review of current and past health concerns, medications, allergies, and SDOH completed.  She is due for CPE and labs, but want to address her mental health issues to help her get back to work as quickly as possible. Will obtain labs today and evaluate for additional needs based on results.  Plan to f/u in 2 weeks for re-evaluation.       Post-COVID syndrome    Fatigue, weakness, shortness of breath, and wheezing present post COVID earlier this year. It does appear that a component of asthma is present based on history and evaluation. She does have a history of asthma present in her brother.  We will trial montelukast and PRN albuterol today and assess for needs for additional medication management based on results. She would benefit from spirometry, but will start with this intervention and refer to pulmonology in the future if minimal intervention is not effective.  Recommend deep breathing and coughing exercises to help strengthen lungs. Recommend avoidance of allergens and smoke that may contribute to worsening symptoms. Recommend COVID vaccinations with booster and wearing a mask when out in public for protection.  Follow-up in 2 weeks.       Relevant Medications   albuterol (VENTOLIN HFA) 108 (90 Base) MCG/ACT inhaler   montelukast (SINGULAIR) 10 MG tablet      Outpatient Encounter Medications as of 05/31/2020  Medication Sig  . albuterol (VENTOLIN HFA) 108 (90 Base) MCG/ACT inhaler Inhale 1-2 puffs into the lungs every 4 (four) hours as needed for wheezing or  shortness of breath.  Marland Kitchen amLODipine (NORVASC) 5 MG tablet Take 1 tablet (5 mg total) by mouth daily.  . montelukast (SINGULAIR) 10 MG tablet Take 1 tablet (10 mg total) by mouth at bedtime.  . Vitamin D, Ergocalciferol, (DRISDOL) 1.25 MG (50000 UT) CAPS capsule Take 1 capsule (50,000 Units total) by mouth every 7 (seven) days.  . [DISCONTINUED] amLODipine (NORVASC) 5 MG tablet Take 1 tablet (5 mg total) by mouth daily.  . [DISCONTINUED] busPIRone (BUSPAR) 10 MG tablet TAKE 1 TABLET BY MOUTH THREE TIMES A DAY  . [DISCONTINUED] hydrochlorothiazide (HYDRODIURIL) 25 MG tablet TAKE 1 TABLET BY MOUTH EVERY DAY  . [DISCONTINUED] hydrOXYzine (ATARAX/VISTARIL) 25 MG tablet Take 1 tablet (25 mg total) by mouth 3 (three) times daily as needed.  . busPIRone (BUSPAR) 10 MG tablet Take 1 tablet ( ) by mouth twice a day.  . hydrochlorothiazide (HYDRODIURIL) 25 MG tablet Take 1 tablet (25 mg total) by mouth daily.  . hydrOXYzine (ATARAX/VISTARIL) 25 MG tablet Take 1 tablet ( ) by mouth 20 minutes before bedtime. May repeat dose if not effective in 20 minutes.   No facility-administered encounter medications on file as of 05/31/2020.   Time: 75 minutes, >50% spent counseling, care coordination, chart review, and documentation including FMLA documentation.    Follow-up: Return in about 13 days (  around 06/13/2020) for BP/Breathing/Mood Check- Ok if virtual desired.   Tollie EthSara E. Renna Kilmer, NP

## 2020-05-31 NOTE — Assessment & Plan Note (Signed)
Fatigue, weakness, shortness of breath, and wheezing present post COVID earlier this year. It does appear that a component of asthma is present based on history and evaluation. She does have a history of asthma present in her brother.  We will trial montelukast and PRN albuterol today and assess for needs for additional medication management based on results. She would benefit from spirometry, but will start with this intervention and refer to pulmonology in the future if minimal intervention is not effective.  Recommend deep breathing and coughing exercises to help strengthen lungs. Recommend avoidance of allergens and smoke that may contribute to worsening symptoms. Recommend COVID vaccinations with booster and wearing a mask when out in public for protection.  Follow-up in 2 weeks.

## 2020-05-31 NOTE — Assessment & Plan Note (Signed)
Symptoms and presentation consistent with asthma. It is unclear if this is resultant of recent COVID infection- she does endorse symptoms prior to her illness, but worsening after.  We will start treatment today with PRN albuterol and daily montelukast to help with symptom management and improved breathing. Wheezes are present, but no signs of acute respiratory distress or fluid overload.  Plan to f/u in 2 weeks.

## 2020-05-31 NOTE — Assessment & Plan Note (Signed)
New patient to establish care today.  Review of current and past health concerns, medications, allergies, and SDOH completed.  She is due for CPE and labs, but want to address her mental health issues to help her get back to work as quickly as possible. Will obtain labs today and evaluate for additional needs based on results.  Plan to f/u in 2 weeks for re-evaluation.

## 2020-05-31 NOTE — Patient Instructions (Signed)
I would like you to check your blood pressure at home and keep a log of what it is looking like. Also write down if the swelling is better or worse.   I would also like you to follow-up on the 12th or 13th so we can re-evaluate how you are feeling and if you are ready to return to work.   I will fill out the paperwork and send it to the [employer] account. I have printed a work note for your boss.   We will plan on a full physical exam with labs in about a month.   Thank you for choosing [place] [facility] for your Primary Care needs. I am excited for the opportunity to partner with you to meet your health care goals. It was a pleasure meeting you today!  I am an Adult-Geriatric Nurse Practitioner with a background in caring for patients for more than 20 years. I received my Paediatric nurse in Nursing and my Doctor of Nursing Practice degrees at [employer]. I received additional fellowship training in primary care and sports medicine after receiving my doctorate degree. I provide primary care and sports medicine services to patients age 93 and older within this office. I am also a provider with the [facility] and the director of the APP Fellowship with [facility].  I am a [place] native, but have called the [place] area home for nearly 20 years and am proud to be a member of this community.   I am passionate about providing the best service to you through preventive medicine and supportive care. I consider you a part of the medical team and value your input. I work diligently to ensure that you are heard and your needs are met in a safe and effective manner. I want you to feel comfortable with me as your provider and want you to know that your health concerns are important to me.   For your information, our office hours are Monday- Friday 8:00 AM - 5:00 PM At this time I am not in the office on Wednesdays.  If you have  questions or concerns, please call our office at [phone] or send [place] a MyChart message and we will respond as quickly as possible.   For all urgent or time sensitive needs we ask that you please call the office to avoid delays. MyChart is not constantly monitored and replies may take up to 72 business hours.  MyChart Policy: . MyChart allows for you to see your visit notes, after visit summary, provider recommendations, lab and tests results, make an appointment, request refills, and contact your provider or the office for non-urgent questions or concerns.  . Providers are seeing patients during normal business hours and do not have built in time to review MyChart messages. We ask that you allow a minimum of 72 business hours for MyChart message responses.  . Complex MyChart concerns may require a visit. Your provider may request you schedule a virtual or in person visit to ensure we are providing the best care possible. . MyChart messages sent after 4:00 PM on Friday will not be received by the provider until Monday morning.    Lab and Test Results: . You will receive your lab and test results on MyChart as soon as they are completed and results have been sent by the lab or testing facility. Due to this service, you will receive your results BEFORE your provider.  Marland Kitchen  Please allow a minimum of 72 business hours for your provider to receive and review lab and test results and contact you about.   . Most lab and test result comments from the provider will be sent through [facility]. Your provider may recommend changes to the plan of care, follow-up visits, repeat testing, ask questions, or request an office visit to discuss these results. You may reply directly to this message or call the office at [phone] to provide information for the provider or set up an appointment. . In some instances, you will be called with test results and recommendations. Please let us know if this is preferred and we will  make note of this in your chart to provide this for you.    . If you have not heard a response to your lab or test results in 72 business hours, please call the office to let us know.   After Hours: . For all non-emergency after hours needs, please call the office at [phone] and select the option to reach the on-call provider service. On-call services are shared between multiple [facility] offices and therefore it will not be possible to speak directly with your provider. On-call providers may provide medical advice and recommendations, but are unable to provide refills for maintenance medications.  . For all emergency or urgent medical needs after normal business hours, we recommend that you seek care at the closest Urgent Care or Emergency Department to ensure appropriate treatment in a timely manner.  [facility] has a 24 hour emergency room located on the ground floor for your convenience.    Please do not hesitate to reach out to [place] with concerns.   Thank you, again, for choosing me as your health care partner. I appreciate your trust and look forward to learning more about you.   Worthy Keeler, DNP, AGNP-c ___________________________________________________________________________________

## 2020-05-31 NOTE — Assessment & Plan Note (Addendum)
Review of chart reveals a history of two thyroid nodules with growth detected on serial ultrasound evaluation. It appears that recommendation from previous providers included fine needle biopsy given the size and appearance of the nodules.  Discussed with patient my recommendation for biopsy given the results from previous studies. Discussed the procedure with the patient and reasons that surveillance is important.  She agrees to follow-up with the endocrinologist she has seen in the past for biopsy.  She will let me know if she needs formal referral for this.  Will monitor labs today.

## 2020-05-31 NOTE — Assessment & Plan Note (Signed)
Exacerbation of anxiety symptoms with recent illness and concerns over the effect this may have on her health in addition to caring for both of her grandparents with serious health issues and chronic health needs.  She has taken off of work starting 04/22 to help care for her grandparents and for her own mental health state. She is supposed to return to work on Monday, but at this time I feel that it is best to have her stay out of work for an additional 2 weeks until we can get her back on medication and her mood improved before adding the stress of work back on her.  She does have signs of depression and anxiety present with positive PHQ9 and GAD7 scores today.  Discussed with patient the importance of self care and continuance of medications to help with her mood.  FMLA paperwork has been sent to my office- will evaluate and fill out forms based on evaluation today, however, did discuss with patient that I am unable to retroactively begin her leave as I had not seen her under my care until today. She expressed understanding of this.  Will plan to follow-up in 2 weeks to re-evaluate mood and overall health. At this time return to work expected 06/17/2020.

## 2020-06-12 NOTE — Progress Notes (Signed)
Virtual Video Visit via MyChart Note  I connected with  Anne Shaffer on 06/14/20 at  1:50 PM EDT by the video enabled telemedicine application for , MyChart, and verified that I am speaking with the correct person using two identifiers.   I introduced myself as a Publishing rights manager with the practice. We discussed the limitations of evaluation and management by telemedicine and the availability of in person appointments. The patient expressed understanding and agreed to proceed.  Participating parties in this visit include: The patient and the nurse practitioner listed. The patient is: In her car I am: In the office  Subjective:    CC:  Chief Complaint  Patient presents with  . Follow-up    HPI: Anne Shaffer is a 35 y.o. year old female presenting today via MyChart today for follow-up for breathing concerns with exacerbation of asthma following COVID-19 earlier this year. She has been on leave from work due to health concerns and caring for her ailing grandparents. She is scheduled to return to work on Monday 06/17/2020. FMLA paperwork was completed at her last visit 05/31/2020 requesting leave.   Today she tells me that addition of montelukast has been beneficial for her asthma. Endorses improved control and less frequent use of rescue inhaler- still using a few times a day. Still experiencing some shortness of breath and feeling like she cannot catch her breath. No fevers, chills, increased mucous production, or increased fatigue.  She also reports that her grandparents are settling at home. Her grandfather is undergoing chemotherapy, but this is taxing on him and they are working to make a decision whether to continue his treatments. He is in his 53's and this is difficult for him.  She reports her stress level is improved, but she is still overwhelmed. She states that she has not been paid for the time that she has taken off and financially it is not feasible to continue on leave,  although, it would be beneficial.    Past medical history, Surgical history, Family history not pertinant except as noted below, Social history, Allergies, and medications have been entered into the medical record, reviewed, and corrections made.   Review of Systems:  All review of systems negative except what is listed in the HPI   Objective:    General:  Speaking clearly in complete sentences. Mild shortness of breath noted.   Alert and oriented x3.   Normal judgment.  Absent acute distress.   Impression and Recommendations:    1. Essential hypertension - hydrochlorothiazide (HYDRODIURIL) 25 MG tablet; Take 1 tablet (25 mg total) by mouth daily.  Dispense: 30 tablet; Refill: 1 - amLODipine (NORVASC) 5 MG tablet; Take 1 tablet (5 mg total) by mouth daily.  Dispense: 30 tablet; Refill: 1  2. Vitamin D deficiency - Vitamin D, Ergocalciferol, (DRISDOL) 1.25 MG (50000 UNIT) CAPS capsule; Take 1 capsule (50,000 Units total) by mouth every 7 (seven) days.  Dispense: 5 capsule; Refill: 3  3. Post-COVID syndrome - albuterol (VENTOLIN HFA) 108 (90 Base) MCG/ACT inhaler; Inhale 1-2 puffs into the lungs every 4 (four) hours as needed for wheezing or shortness of breath.  Dispense: 6.7 g; Refill: 1 - montelukast (SINGULAIR) 10 MG tablet; Take 1 tablet (10 mg total) by mouth at bedtime.  Dispense: 30 tablet; Refill: 1 - Fluticasone-Umeclidin-Vilant (TRELEGY ELLIPTA) 100-62.5-25 MCG/INH AEPB; Inhale 1 puff into the lungs daily.  Dispense: 28 each; Refill: 11  4. Moderate persistent asthma, unspecified whether complicated - albuterol (VENTOLIN HFA) 108 (  90 Base) MCG/ACT inhaler; Inhale 1-2 puffs into the lungs every 4 (four) hours as needed for wheezing or shortness of breath.  Dispense: 6.7 g; Refill: 1 - montelukast (SINGULAIR) 10 MG tablet; Take 1 tablet (10 mg total) by mouth at bedtime.  Dispense: 30 tablet; Refill: 1 - Fluticasone-Umeclidin-Vilant (TRELEGY ELLIPTA) 100-62.5-25 MCG/INH  AEPB; Inhale 1 puff into the lungs daily.  Dispense: 28 each; Refill: 11  5. Anxiety - busPIRone (BUSPAR) 10 MG tablet; Take 1 tablet (10mg ) by mouth twice a day.  Dispense: 60 tablet; Refill: 1 - hydrOXYzine (ATARAX/VISTARIL) 25 MG tablet; Take 1 tablet (25mg ) by mouth 20 minutes before bedtime. May repeat dose if not effective in 20 minutes.  Dispense: 60 tablet; Refill: 1  Refills provided on all medication as she recently changed insurance and pharmacies and some of th prescriptions did not come over.  No concerns today. Has not taken her BP today- no warning signs. Continue buspar and hydroxyzine for anxiety.  Sample of Trelegy Ellipta 100mg  left at front desk for patient. If beneficial, will send script with coupon card.  If not helpful, let me know and we will consider chest x-ray and pulmonology referral for spirometry.  F/U in 6 months or sooner if needed.   Follow-up if symptoms worsen or fail to improve.    I discussed the assessment and treatment plan with the patient. The patient was provided an opportunity to ask questions and all were answered. The patient agreed with the plan and demonstrated an understanding of the instructions.   The patient was advised to call back or seek an in-person evaluation if the symptoms worsen or if the condition fails to improve as anticipated.  I provided 20 minutes of non-face-to-face interaction with this MYCHART visit including intake, same-day documentation, and chart review.   , NP

## 2020-06-13 ENCOUNTER — Other Ambulatory Visit: Payer: Self-pay

## 2020-06-13 ENCOUNTER — Encounter (HOSPITAL_BASED_OUTPATIENT_CLINIC_OR_DEPARTMENT_OTHER): Payer: Self-pay | Admitting: Nurse Practitioner

## 2020-06-13 ENCOUNTER — Telehealth (INDEPENDENT_AMBULATORY_CARE_PROVIDER_SITE_OTHER): Payer: 59 | Admitting: Nurse Practitioner

## 2020-06-13 DIAGNOSIS — F419 Anxiety disorder, unspecified: Secondary | ICD-10-CM

## 2020-06-13 DIAGNOSIS — U099 Post covid-19 condition, unspecified: Secondary | ICD-10-CM

## 2020-06-13 DIAGNOSIS — J4540 Moderate persistent asthma, uncomplicated: Secondary | ICD-10-CM | POA: Diagnosis not present

## 2020-06-13 DIAGNOSIS — I10 Essential (primary) hypertension: Secondary | ICD-10-CM

## 2020-06-13 DIAGNOSIS — E559 Vitamin D deficiency, unspecified: Secondary | ICD-10-CM | POA: Diagnosis not present

## 2020-06-13 MED ORDER — VITAMIN D (ERGOCALCIFEROL) 1.25 MG (50000 UNIT) PO CAPS: 50000.0000 [IU] | ORAL_CAPSULE | ORAL | 3 refills | Status: DC

## 2020-06-13 MED ORDER — BUSPIRONE HCL 10 MG PO TABS: ORAL_TABLET | ORAL | 1 refills | Status: DC

## 2020-06-13 MED ORDER — MONTELUKAST SODIUM 10 MG PO TABS: 10.0000 mg | ORAL_TABLET | Freq: Every day | ORAL | 1 refills | Status: DC

## 2020-06-13 MED ORDER — ALBUTEROL SULFATE HFA 108 (90 BASE) MCG/ACT IN AERS: 1.0000 | INHALATION_SPRAY | RESPIRATORY_TRACT | 1 refills | Status: DC | PRN

## 2020-06-13 MED ORDER — HYDROXYZINE HCL 25 MG PO TABS: ORAL_TABLET | ORAL | 1 refills | Status: DC

## 2020-06-13 MED ORDER — HYDROCHLOROTHIAZIDE 25 MG PO TABS: 1.0000 | ORAL_TABLET | Freq: Every day | ORAL | 1 refills | Status: DC

## 2020-06-13 MED ORDER — TRELEGY ELLIPTA 100-62.5-25 MCG/INH IN AEPB: 1.0000 | INHALATION_SPRAY | Freq: Every day | RESPIRATORY_TRACT | 11 refills | Status: DC

## 2020-06-13 MED ORDER — AMLODIPINE BESYLATE 5 MG PO TABS: 5.0000 mg | ORAL_TABLET | Freq: Every day | ORAL | 1 refills | Status: DC

## 2020-06-13 NOTE — Patient Instructions (Addendum)
Let's plan on a video visit in another month to see how the inhaler is working for you and see how your weight loss is going.   We can talk about medications once we have documentation that you have been working on exercise :-)

## 2020-06-29 ENCOUNTER — Other Ambulatory Visit (HOSPITAL_BASED_OUTPATIENT_CLINIC_OR_DEPARTMENT_OTHER): Payer: Self-pay | Admitting: Nurse Practitioner

## 2020-06-29 DIAGNOSIS — U099 Post covid-19 condition, unspecified: Secondary | ICD-10-CM

## 2020-06-29 DIAGNOSIS — J4540 Moderate persistent asthma, uncomplicated: Secondary | ICD-10-CM

## 2020-07-23 ENCOUNTER — Other Ambulatory Visit (HOSPITAL_BASED_OUTPATIENT_CLINIC_OR_DEPARTMENT_OTHER): Payer: Self-pay | Admitting: Nurse Practitioner

## 2020-07-23 DIAGNOSIS — I10 Essential (primary) hypertension: Secondary | ICD-10-CM

## 2020-07-29 ENCOUNTER — Other Ambulatory Visit (HOSPITAL_BASED_OUTPATIENT_CLINIC_OR_DEPARTMENT_OTHER): Payer: Self-pay | Admitting: Nurse Practitioner

## 2020-07-29 DIAGNOSIS — U099 Post covid-19 condition, unspecified: Secondary | ICD-10-CM

## 2020-07-29 DIAGNOSIS — J4540 Moderate persistent asthma, uncomplicated: Secondary | ICD-10-CM

## 2020-08-18 ENCOUNTER — Other Ambulatory Visit (HOSPITAL_BASED_OUTPATIENT_CLINIC_OR_DEPARTMENT_OTHER): Payer: Self-pay | Admitting: Nurse Practitioner

## 2020-08-18 DIAGNOSIS — I10 Essential (primary) hypertension: Secondary | ICD-10-CM

## 2020-09-14 ENCOUNTER — Other Ambulatory Visit (HOSPITAL_BASED_OUTPATIENT_CLINIC_OR_DEPARTMENT_OTHER): Payer: Self-pay | Admitting: Nurse Practitioner

## 2020-09-14 DIAGNOSIS — U099 Post covid-19 condition, unspecified: Secondary | ICD-10-CM

## 2020-09-14 DIAGNOSIS — J4540 Moderate persistent asthma, uncomplicated: Secondary | ICD-10-CM

## 2020-09-16 ENCOUNTER — Other Ambulatory Visit (HOSPITAL_BASED_OUTPATIENT_CLINIC_OR_DEPARTMENT_OTHER): Payer: Self-pay | Admitting: Nurse Practitioner

## 2020-09-16 DIAGNOSIS — I10 Essential (primary) hypertension: Secondary | ICD-10-CM

## 2020-09-24 ENCOUNTER — Other Ambulatory Visit (HOSPITAL_BASED_OUTPATIENT_CLINIC_OR_DEPARTMENT_OTHER): Payer: Self-pay | Admitting: Nurse Practitioner

## 2020-09-24 DIAGNOSIS — E559 Vitamin D deficiency, unspecified: Secondary | ICD-10-CM

## 2020-10-16 ENCOUNTER — Other Ambulatory Visit (HOSPITAL_BASED_OUTPATIENT_CLINIC_OR_DEPARTMENT_OTHER): Payer: Self-pay | Admitting: Nurse Practitioner

## 2020-10-16 DIAGNOSIS — F419 Anxiety disorder, unspecified: Secondary | ICD-10-CM

## 2020-10-31 ENCOUNTER — Other Ambulatory Visit: Payer: Self-pay

## 2020-10-31 ENCOUNTER — Telehealth (HOSPITAL_BASED_OUTPATIENT_CLINIC_OR_DEPARTMENT_OTHER): Payer: Self-pay

## 2020-10-31 ENCOUNTER — Ambulatory Visit (INDEPENDENT_AMBULATORY_CARE_PROVIDER_SITE_OTHER): Payer: 59 | Admitting: Nurse Practitioner

## 2020-10-31 NOTE — Telephone Encounter (Signed)
Attempted to call patient to get her checked in for telephone office visit.  Patient did not answer and I could not leave a voicemail.

## 2020-10-31 NOTE — Progress Notes (Signed)
Virtual Visit Encounter telephone visit. Intake call at 1401- no answer 1411- no answer 1421- no answer 1430- no answer 1439- no answer Unable to leave voicemail at any time. Call directed to electronic message then disconnected.  No show for appointment marked.

## 2020-11-01 ENCOUNTER — Encounter (HOSPITAL_BASED_OUTPATIENT_CLINIC_OR_DEPARTMENT_OTHER): Payer: Self-pay | Admitting: Nurse Practitioner

## 2020-11-01 ENCOUNTER — Ambulatory Visit (INDEPENDENT_AMBULATORY_CARE_PROVIDER_SITE_OTHER): Payer: 59 | Admitting: Nurse Practitioner

## 2020-11-01 VITALS — Ht 65.0 in

## 2020-11-01 DIAGNOSIS — G43009 Migraine without aura, not intractable, without status migrainosus: Secondary | ICD-10-CM | POA: Diagnosis not present

## 2020-11-01 DIAGNOSIS — J4540 Moderate persistent asthma, uncomplicated: Secondary | ICD-10-CM

## 2020-11-01 DIAGNOSIS — F99 Mental disorder, not otherwise specified: Secondary | ICD-10-CM

## 2020-11-01 DIAGNOSIS — F5105 Insomnia due to other mental disorder: Secondary | ICD-10-CM | POA: Diagnosis not present

## 2020-11-01 DIAGNOSIS — F419 Anxiety disorder, unspecified: Secondary | ICD-10-CM

## 2020-11-01 MED ORDER — TRELEGY ELLIPTA 100-62.5-25 MCG/INH IN AEPB: 1.0000 | INHALATION_SPRAY | Freq: Every day | RESPIRATORY_TRACT | 11 refills | Status: DC

## 2020-11-01 MED ORDER — TRAZODONE HCL 50 MG PO TABS: 25.0000 mg | ORAL_TABLET | Freq: Every evening | ORAL | 3 refills | Status: DC | PRN

## 2020-11-01 MED ORDER — SERTRALINE HCL 50 MG PO TABS: 50.0000 mg | ORAL_TABLET | Freq: Every day | ORAL | 3 refills | Status: DC

## 2020-11-01 MED ORDER — SUMATRIPTAN SUCCINATE 50 MG PO TABS: ORAL_TABLET | ORAL | 2 refills | Status: DC

## 2020-11-01 NOTE — Assessment & Plan Note (Signed)
Current exacerbation due to weather changes. Not well controlled on current therapies Prescription for Trelegy sent to pharmacy for patient- excellent response with trial use.  Will follow-up in 4 weeks or sooner if needed to evaluate effectiveness.

## 2020-11-01 NOTE — Patient Instructions (Addendum)
I recommend starting: Sertraline 50mg  at bedtime for depression and anxiety symptoms  You can take Trazodone 25-50mg  as needed at bedtime for sleep and anxiety Take this in place of the hydroxyzine  I have sent in medication for the migraine for you. If this is not helpful, please let me know.    I will send a note through MyChart to excuse you from work for this week.   Phone call in 4 weeks to see how you are feeling with the new medications, unless anything changes and you are feeling worse. Then I want you to let me know.

## 2020-11-01 NOTE — Assessment & Plan Note (Signed)
Insomnia related to increased anxiety Will try trazodone in addition to starting sertraline for symptom management. F/U in 4 weeks.

## 2020-11-01 NOTE — Progress Notes (Signed)
Virtual Visit Encounter telephone visit.   I connected with  Anne Shaffer on 11/01/20 at 10:10 AM EDT by secure audio and/or video enabled telemedicine application. I verified that I am speaking with the correct person using two identifiers.   I introduced myself as a Publishing rights manager with the practice. The limitations of evaluation and management by telemedicine discussed with the patient and the availability of in person appointments. The patient expressed verbal understanding and consent to proceed.  Participating parties in this visit include: Myself and patient  The patient is: Patient Location: Home I am: Provider Location: Office/Clinic Subjective:    CC and HPI: Anne Shaffer is a 35 y.o. year old female presenting for increased anxiety, asthma exacerbation, and migraines.   [name] endorses increased depression and anxiety symptoms over the past several weeks.  She reports that she has had issues with her anxiety and has been unable to sleep, which has negatively impacted her at work and subsequently created more anxiety. She reports feeling added pressure at her job and feeling as though "they are tired of me being sick".  She states that falling asleep at night is very difficult even when taking hydroxyzine 50mg . She reports being up for hours and feeling tired, but she is unable to fall asleep. She reports she is exhausted all the time.  She feels that this, along with changes in the weather have also triggered migraines and exacerbation of her asthma.   She reports migraine symptoms started on Tuesday afternoon and she went home from work Anne Shaffer. Since that time she has had a daily migraine with sensitivity to light and sound and nausea. She has tried OTC medications without much success at keeping the migraine away.   She also reports increased congestion and shortness of breath with exertion that have been going on since the weather change. She feels her asthma is uncontrolled  at this time and finds herself using her rescue inhaler at least daily. 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 SHe had excellent results with the Trelegy sample provided at her last visit and feels that this would be helpful for her. She did take a COVID test 2 days ago and it was negative.   She has had no fever, chills, diarrhea, or emesis.  She has not been able to return to work since Tuesday and is concerned that they are going to be upset with her about this.  Past medical history, Surgical history, Family history not pertinant except as noted below, Social history, Allergies, and medications have been entered into the medical record, reviewed, and corrections made.   Review of Systems:  All review of systems negative except what is listed in the HPI  Objective:    Alert and oriented x 4 Speaking in clear sentences- sounds congested and fatigued.  No distress.  Impression and Recommendations:    Problem List Items Addressed This Visit     Anxiety - Primary    Exacerbation of symptoms with increased fatigue and work stressors Appears to be in cycle of anxiety and insomnia, which is exacerbating symptoms.  No alarm symptoms present today.  Discussed daily medication to help relieve symptoms- she is agreeable today Will try sertraline 50mg  + trazodone 25-50mg  at bedtime to see if we can get her mood and sleep stable.  Referral placed for counseling services.  Will f/u in 4 weeks to monitor  effectiveness of medication- patient aware to contact us sooner if changes occur.       Relevant Medications   sertraline (ZOLOFT) 50 MG tablet   traZODone (DESYREL) 50 MG tablet   Other Relevant Orders   Ambulatory referral to Psychology   Moderate persistent asthma    Current exacerbation due to weather changes. Not well controlled on current therapies Prescription for Trelegy sent to  pharmacy for patient- excellent response with trial use.  Will follow-up in 4 weeks or sooner if needed to evaluate effectiveness.       Relevant Medications   Fluticasone-Umeclidin-Vilant (TRELEGY ELLIPTA) 100-62.5-25 MCG/INH AEPB   Insomnia due to other mental disorder    Insomnia related to increased anxiety Will try trazodone in addition to starting sertraline for symptom management. F/U in 4 weeks.       Relevant Medications   sertraline (ZOLOFT) 50 MG tablet   traZODone (DESYREL) 50 MG tablet   Other Relevant Orders   Ambulatory referral to Psychology   Migraine without aura and without status migrainosus, not intractable    Ongoing migraine x 3 days non responsive to OTC medications Will try imitrex for acute migraine relief No alarm symptoms present.  F/U if symptoms worsen or fail to improve.       Relevant Medications   sertraline (ZOLOFT) 50 MG tablet   traZODone (DESYREL) 50 MG tablet   SUMAtriptan (IMITREX) 50 MG tablet    I discussed the assessment and treatment plan with the patient. The patient was provided an opportunity to ask questions and all were answered. The patient agreed with the plan and demonstrated an understanding of the instructions.   The patient was advised to call back or seek an in-person evaluation if the symptoms worsen or if the condition fails to improve as anticipated.  Follow-Up: in a month  I provided 24 minutes of non-face-to-face interaction with this non face-to-face encounter including intake, same-day documentation, and chart review.   Tollie Eth, NP , DNP, AGNP-c [facility] Medicine at [facility] [phone] (fax)

## 2020-11-01 NOTE — Assessment & Plan Note (Signed)
Exacerbation of symptoms with increased fatigue and work stressors Appears to be in cycle of anxiety and insomnia, which is exacerbating symptoms.  No alarm symptoms present today.  Discussed daily medication to help relieve symptoms- she is agreeable today Will try sertraline 50mg  + trazodone 25-50mg  at bedtime to see if we can get her mood and sleep stable.  Referral placed for counseling services.  Will f/u in 4 weeks to monitor effectiveness of medication- patient aware to contact sooner if changes occur.

## 2020-11-01 NOTE — Assessment & Plan Note (Signed)
Ongoing migraine x 3 days non responsive to OTC medications Will try imitrex for acute migraine relief No alarm symptoms present.  F/U if symptoms worsen or fail to improve.

## 2020-11-08 ENCOUNTER — Telehealth (HOSPITAL_BASED_OUTPATIENT_CLINIC_OR_DEPARTMENT_OTHER): Payer: Self-pay

## 2020-11-08 ENCOUNTER — Encounter (HOSPITAL_BASED_OUTPATIENT_CLINIC_OR_DEPARTMENT_OTHER): Payer: Self-pay | Admitting: Nurse Practitioner

## 2020-11-08 ENCOUNTER — Other Ambulatory Visit (HOSPITAL_BASED_OUTPATIENT_CLINIC_OR_DEPARTMENT_OTHER): Payer: Self-pay | Admitting: Nurse Practitioner

## 2020-11-08 ENCOUNTER — Telehealth (HOSPITAL_BASED_OUTPATIENT_CLINIC_OR_DEPARTMENT_OTHER): Payer: Self-pay | Admitting: Nurse Practitioner

## 2020-11-08 DIAGNOSIS — J4541 Moderate persistent asthma with (acute) exacerbation: Secondary | ICD-10-CM

## 2020-11-08 MED ORDER — PREDNISONE 50 MG PO TABS: 50.0000 mg | ORAL_TABLET | Freq: Every day | ORAL | 0 refills | Status: DC

## 2020-11-08 NOTE — Telephone Encounter (Signed)
Pt stated that she is needing a letter to be out of work longer due to her still not feeling the best and getting used to the new medication that was prescribed. Pt would like a call from CMA regarding this.  Please advise.

## 2020-11-08 NOTE — Telephone Encounter (Signed)
Patient states she is still not feeling well.  She went back to work on Wednesday and was able to work a half day; however, she has not returned.  Her employer will not allow her to return to work without a note from her doctor. She would like for you to give her a call.

## 2020-11-11 ENCOUNTER — Encounter (HOSPITAL_BASED_OUTPATIENT_CLINIC_OR_DEPARTMENT_OTHER): Payer: Self-pay | Admitting: Nurse Practitioner

## 2020-11-12 MED ORDER — PREDNISONE 50 MG PO TABS: 50.0000 mg | ORAL_TABLET | Freq: Every day | ORAL | 0 refills | Status: DC

## 2020-11-15 ENCOUNTER — Other Ambulatory Visit (HOSPITAL_BASED_OUTPATIENT_CLINIC_OR_DEPARTMENT_OTHER): Payer: Self-pay | Admitting: Nurse Practitioner

## 2020-11-15 DIAGNOSIS — J4540 Moderate persistent asthma, uncomplicated: Secondary | ICD-10-CM

## 2020-11-15 DIAGNOSIS — U099 Post covid-19 condition, unspecified: Secondary | ICD-10-CM

## 2020-11-16 ENCOUNTER — Other Ambulatory Visit (HOSPITAL_BASED_OUTPATIENT_CLINIC_OR_DEPARTMENT_OTHER): Payer: Self-pay | Admitting: Nurse Practitioner

## 2020-11-16 DIAGNOSIS — I10 Essential (primary) hypertension: Secondary | ICD-10-CM

## 2020-11-29 ENCOUNTER — Other Ambulatory Visit: Payer: Self-pay

## 2020-11-29 ENCOUNTER — Ambulatory Visit (INDEPENDENT_AMBULATORY_CARE_PROVIDER_SITE_OTHER): Payer: 59 | Admitting: Nurse Practitioner

## 2020-11-29 ENCOUNTER — Telehealth (HOSPITAL_BASED_OUTPATIENT_CLINIC_OR_DEPARTMENT_OTHER): Payer: Self-pay

## 2020-11-29 ENCOUNTER — Encounter (HOSPITAL_BASED_OUTPATIENT_CLINIC_OR_DEPARTMENT_OTHER): Payer: Self-pay | Admitting: Nurse Practitioner

## 2020-11-29 ENCOUNTER — Encounter (HOSPITAL_BASED_OUTPATIENT_CLINIC_OR_DEPARTMENT_OTHER): Payer: Self-pay

## 2020-11-29 VITALS — Ht 65.0 in

## 2020-11-29 DIAGNOSIS — F419 Anxiety disorder, unspecified: Secondary | ICD-10-CM

## 2020-11-29 DIAGNOSIS — G43009 Migraine without aura, not intractable, without status migrainosus: Secondary | ICD-10-CM

## 2020-11-29 DIAGNOSIS — G43011 Migraine without aura, intractable, with status migrainosus: Secondary | ICD-10-CM

## 2020-11-29 MED ORDER — NURTEC 75 MG PO TBDP: 75.0000 mg | ORAL_TABLET | ORAL | 11 refills | Status: DC | PRN

## 2020-11-29 NOTE — Progress Notes (Addendum)
Virtual Visit Encounter  telephone visit.   I connected with  Janica Eldred Vore on 12/02/20 at  3:30 PM EDT by secure audio and/or video enabled telemedicine application. I verified that I am speaking with the correct person using two identifiers.   I introduced myself as a Publishing rights manager with the practice. The limitations of evaluation and management by telemedicine discussed with the patient and the availability of in person appointments. The patient expressed verbal understanding and consent to proceed.  Participating parties in this visit include: Myself and patient  The patient is: Patient Location: Home I am: Provider Location: Office/Clinic Subjective:    CC and HPI: Anne Shaffer is a 35 y.o. year old female presenting for new evaluation and treatment of migraine and mood.  She endorses frequent and chronic migraine headaches that are debilitating.  She has been out of work several days over the past few weeks.  She has recently sent paperwork for FMLA coverage for this as it has been quite difficult.  She is have 1-3 migraines per week on average with some lasting 1-2 days. She endorses sensitivity to light and sound, fatigue, nausea, decreased appetite, and pain.  She reports the pain has been so severe the last two days that it is radiating down into her neck.   She tells me that the frequent migraines have negatively affected her sleep and her mood.  She feels down about the constant symptoms and frustrated that treatment is not effective.  She has tried sumatriptan, naproxen, acetamenophen,  sumatriptan, and sertraline with no changes in the frequency or intensity of migraines.  Abortive treatments are not effective at resolving her pain.    Past medical history, Surgical history, Family history not pertinant except as noted below, Social history, Allergies, and medications have been entered into the medical record, reviewed, and corrections made.   Review of Systems:   All review of systems negative except what is listed in the HPI  Objective:    Alert and oriented x 4 Speaking in clear sentences with no shortness of breath. No distress.  Impression and Recommendations:    Problem List Items Addressed This Visit     Anxiety    Worsening anxiety symptoms related to frequent and prolonged chronic migraine headaches.  She is not sleeping well and not eating well, which is also likely contributing.  Unfortunately, the volume and duration of migraines has caused her to miss multiple work days and this is distressing for her as well.  We are working to control the migraines and will change medications today.        Migraine without aura and without status migrainosus, not intractable - Primary    Debilitating migraines continue with increased missed work days.  She has tried and failed triptan therapy.  Migraines are contributing to increased anxiety symptoms as this is putting significant burden on work and home life.  Will try Nurtec for treatment, as this has been effective for her mother.  Recommend pregnancy test prior to taking this medication to avoid possible fetal harm. She is agreeable to do this.  If this is not effective, we will plan to send to neurology for further evaluation and recommendations.       Relevant Medications   Rimegepant Sulfate (NURTEC) 75 MG TBDP    orders and follow up as documented in EMR I discussed the assessment and treatment plan with the patient. The patient was provided an opportunity to ask questions and all were answered.  The patient agreed with the plan and demonstrated an understanding of the instructions.   The patient was advised to call back or seek an in-person evaluation if the symptoms worsen or if the condition fails to improve as anticipated.  Follow-Up: in 3 months  I provided 20 minutes of non-face-to-face interaction with this non face-to-face encounter including intake, same-day  documentation, and chart review.   Tollie Eth, NP , DNP, AGNP-c [facility] Medicine at [facility] [phone] (fax)

## 2020-11-29 NOTE — Telephone Encounter (Signed)
Left voicemail for patient to call back to get checked in for telephone office visit.

## 2020-11-29 NOTE — Patient Instructions (Signed)
[email or web address]  this is the link to the coupon card to get the prescription for as little as $0 for 30 day supply.

## 2020-11-30 NOTE — Assessment & Plan Note (Signed)
Worsening anxiety symptoms related to frequent and prolonged chronic migraine headaches.  She is not sleeping well and not eating well, which is also likely contributing.  Unfortunately, the volume and duration of migraines has caused her to miss multiple work days and this is distressing for her as well.  We are working to control the migraines and will change medications today.

## 2020-11-30 NOTE — Assessment & Plan Note (Signed)
Debilitating migraines continue with increased missed work days.  She has tried and failed triptan therapy.  Migraines are contributing to increased anxiety symptoms as this is putting significant burden on work and home life.  Will try Nurtec for treatment, as this has been effective for her mother.  Recommend pregnancy test prior to taking this medication to avoid possible fetal harm. She is agreeable to do this.  If this is not effective, we will plan to send to neurology for further evaluation and recommendations.

## 2020-12-04 ENCOUNTER — Ambulatory Visit: Payer: 59 | Admitting: Psychologist

## 2020-12-15 ENCOUNTER — Other Ambulatory Visit (HOSPITAL_BASED_OUTPATIENT_CLINIC_OR_DEPARTMENT_OTHER): Payer: Self-pay | Admitting: Nurse Practitioner

## 2020-12-15 DIAGNOSIS — I10 Essential (primary) hypertension: Secondary | ICD-10-CM

## 2020-12-19 ENCOUNTER — Ambulatory Visit (INDEPENDENT_AMBULATORY_CARE_PROVIDER_SITE_OTHER): Payer: 59 | Admitting: Psychologist

## 2020-12-19 DIAGNOSIS — F320 Major depressive disorder, single episode, mild: Secondary | ICD-10-CM | POA: Diagnosis not present

## 2020-12-19 DIAGNOSIS — F411 Generalized anxiety disorder: Secondary | ICD-10-CM | POA: Diagnosis not present

## 2021-01-03 ENCOUNTER — Ambulatory Visit (INDEPENDENT_AMBULATORY_CARE_PROVIDER_SITE_OTHER): Payer: 59 | Admitting: Psychologist

## 2021-01-03 DIAGNOSIS — F411 Generalized anxiety disorder: Secondary | ICD-10-CM | POA: Diagnosis not present

## 2021-01-03 DIAGNOSIS — F320 Major depressive disorder, single episode, mild: Secondary | ICD-10-CM | POA: Diagnosis not present

## 2021-01-09 ENCOUNTER — Ambulatory Visit (INDEPENDENT_AMBULATORY_CARE_PROVIDER_SITE_OTHER): Payer: 59 | Admitting: Psychologist

## 2021-01-09 DIAGNOSIS — F320 Major depressive disorder, single episode, mild: Secondary | ICD-10-CM

## 2021-01-09 DIAGNOSIS — F411 Generalized anxiety disorder: Secondary | ICD-10-CM | POA: Diagnosis not present

## 2021-01-09 NOTE — Progress Notes (Signed)
[facility] Counselor/Therapist Progress Note  Patient ID: Anne Shaffer, MRN: [id number],    Date: 01/09/2021  Time Spent: 8:00 am to 8:22 am; Total Time: 22 minutes   This session was held via video webex teletherapy due to the coronavirus risk at this time. The patient consented to video teletherapy and was located at her home during this session. She is aware it is the responsibility of the patient to secure confidentiality on her end of the session. The provider was in a private home office for the duration of this session. Limits of confidentiality were discussed with the patient.    Treatment Type: Individual Therapy  Reported Symptoms: Patient endorsed experiencing some anxiety symptoms  Mental Status Exam: Appearance:  Well Groomed     Behavior: Appropriate  Motor: Normal  Speech/Language:  Normal Rate  Affect: Appropriate  Mood: normal  Thought process: normal  Thought content:   WNL  Sensory/Perceptual disturbances:   WNL  Orientation: oriented to person, place, time/date, situation, and day of week  Attention: Good  Concentration: Good  Memory: WNL  Fund of knowledge:  Fair  Insight:   Poor  Judgment:  Poor  Impulse Control: Good   Risk Assessment: Danger to Self:  No Self-injurious Behavior: No Danger to Others: No Duty to Warn:no Physical Aggression / Violence:No  Access to Firearms a concern: No  Gang Involvement:No   Subjective: Beginning the session, patient stated that she has already begun to establish boundaries with people in her life. From there, she spent the session reflecting on establishing boundaries with someone in particular. She reflected on the interpersonal exchanges that have caused distress. She processed thoughts and emotions she was experiencing. She was agreeable to following up. She denied suicidal and homicidal ideation.    Interventions: Worked on developing a therapeutic relationship with the patient using active  listening and reflective statements. Provided emotional support using empathy and validation. Reviewed the treatment plan with the patient. Used summary statements. Reflected on events since the last session. Praised patient for establishing boundaries with people in her life. Used socratic questions to assist the patient gain insight into self. Explored the interpersonal dynamics that are contributing distress. Challenged some of the thoughts expressed. Challenged some of the patient's perceptions of the situation. Provided psychoeducation regarding communication skills. Assigned homework. Assessed for suicidal and homicidal ideation.   Homework: Implement communication skills to establish boundaries  Next Session: Review homework and emotional support  Long Term Goal: Improve interpersonal relationships to decrease distress from daily to five times a week.  Short Term Goal: Patient will implement new communication tools to establish boundaries   Diagnosis: F32.0 major depressive affective disorder, single episode, mild and F41.1 generalized anxiety disorder  Plan: The patient will follow up in the middle of January. The patient's treatment plan is found in therapycharts. The patient reviewed and agreed to the treatment plan found in therapycharts.   Hilbert Corrigan, PsyD

## 2021-01-17 ENCOUNTER — Ambulatory Visit: Payer: 59 | Admitting: Psychologist

## 2021-02-12 ENCOUNTER — Other Ambulatory Visit (HOSPITAL_BASED_OUTPATIENT_CLINIC_OR_DEPARTMENT_OTHER): Payer: Self-pay | Admitting: Nurse Practitioner

## 2021-02-12 DIAGNOSIS — J4540 Moderate persistent asthma, uncomplicated: Secondary | ICD-10-CM

## 2021-02-12 DIAGNOSIS — U099 Post covid-19 condition, unspecified: Secondary | ICD-10-CM

## 2021-02-14 ENCOUNTER — Ambulatory Visit: Payer: 59 | Admitting: Psychologist

## 2021-02-28 ENCOUNTER — Telehealth: Payer: 59 | Admitting: Physician Assistant

## 2021-02-28 DIAGNOSIS — R197 Diarrhea, unspecified: Secondary | ICD-10-CM | POA: Diagnosis not present

## 2021-02-28 MED ORDER — ONDANSETRON HCL 4 MG PO TABS: 4.0000 mg | ORAL_TABLET | Freq: Three times a day (TID) | ORAL | 0 refills | Status: DC | PRN

## 2021-02-28 MED ORDER — CIPROFLOXACIN HCL 500 MG PO TABS: 500.0000 mg | ORAL_TABLET | Freq: Two times a day (BID) | ORAL | 0 refills | Status: AC

## 2021-02-28 NOTE — Patient Instructions (Signed)
Janna M Bond, thank you for joining Mar Daring, PA-C for today's virtual visit.  While this provider is not your primary care provider (PCP), if your PCP is located in our provider database this encounter information will be shared with them immediately following your visit.  Consent: (Patient) Anne Shaffer provided verbal consent for this virtual visit at the beginning of the encounter.  Current Medications:  Current Outpatient Medications:    ciprofloxacin (CIPRO) 500 MG tablet, Take 1 tablet (500 mg total) by mouth 2 (two) times daily for 7 days., Disp: 14 tablet, Rfl: 0   ondansetron (ZOFRAN) 4 MG tablet, Take 1 tablet (4 mg total) by mouth every 8 (eight) hours as needed for nausea or vomiting., Disp: 20 tablet, Rfl: 0   albuterol (VENTOLIN HFA) 108 (90 Base) MCG/ACT inhaler, INHALE 1 TO 2 PUFFS INTO THE LUNGS EVERY 4 HOURS AS NEEDED FOR WHEEZING OR SHORTNESS OF BREATH, Disp: 6.7 g, Rfl: 5   amLODipine (NORVASC) 5 MG tablet, TAKE 1 TABLET(5 MG) BY MOUTH DAILY, Disp: 90 tablet, Rfl: 0   hydrochlorothiazide (HYDRODIURIL) 25 MG tablet, TAKE 1 TABLET BY MOUTH EVERY DAY, Disp: 90 tablet, Rfl: 0   montelukast (SINGULAIR) 10 MG tablet, TAKE 1 TABLET(10 MG) BY MOUTH AT BEDTIME, Disp: 30 tablet, Rfl: 0   predniSONE (DELTASONE) 50 MG tablet, Take 1 tablet (50 mg total) by mouth daily., Disp: 5 tablet, Rfl: 0   Rimegepant Sulfate (NURTEC) 75 MG TBDP, Take 75 mg by mouth as needed (Migraine)., Disp: 8 tablet, Rfl: 11   sertraline (ZOLOFT) 50 MG tablet, Take 1 tablet (50 mg total) by mouth daily., Disp: 30 tablet, Rfl: 3   traZODone (DESYREL) 50 MG tablet, Take 0.5-1 tablets (25-50 mg total) by mouth at bedtime as needed for sleep., Disp: 30 tablet, Rfl: 3   Vitamin D, Ergocalciferol, (DRISDOL) 1.25 MG (50000 UNIT) CAPS capsule, TAKE 1 CAPSULE BY MOUTH EVERY 7 DAYS, Disp: 12 capsule, Rfl: 1   Medications ordered in this encounter:  Meds ordered this encounter  Medications    ciprofloxacin (CIPRO) 500 MG tablet    Sig: Take 1 tablet (500 mg total) by mouth 2 (two) times daily for 7 days.    Dispense:  14 tablet    Refill:  0    Order Specific Question:   Supervising Provider    Answer:   MILLER, BRIAN [[id number]]   ondansetron (ZOFRAN) 4 MG tablet    Sig: Take 1 tablet (4 mg total) by mouth every 8 (eight) hours as needed for nausea or vomiting.    Dispense:  20 tablet    Refill:  0    Order Specific Question:   Supervising Provider    Answer:   Sabra Heck, BRIAN [[id number]]     *If you need refills on other medications prior to your next appointment, please contact your pharmacy*  Follow-Up: Call back or seek an in-person evaluation if the symptoms worsen or if the condition fails to improve as anticipated.  Other Instructions Diarrhea, Adult Diarrhea is frequent loose and watery bowel movements. Diarrhea can make you feel weak and cause you to become dehydrated. Dehydration can make you tired and thirsty, cause you to have a dry mouth, and decrease how often you urinate. Diarrhea typically lasts 2-3 days. However, it can last longer if it is a sign of something more serious. It is important to treat your diarrhea as told by your health care provider. Follow these instructions at home: Eating and drinking  Follow these recommendations as told by your health care provider: Take an oral rehydration solution (ORS). This is an over-the-counter medicine that helps return your body to its normal balance of nutrients and water. It is found at pharmacies and retail stores. Drink plenty of fluids, such as water, ice chips, diluted fruit juice, and low-calorie sports drinks. You can drink milk also, if desired. Avoid drinking fluids that contain a lot of sugar or caffeine, such as energy drinks, sports drinks, and soda. Eat bland, easy-to-digest foods in small amounts as you are able. These foods include bananas, applesauce, rice, lean meats, toast, and crackers. Avoid  alcohol. Avoid spicy or fatty foods.  Medicines Take over-the-counter and prescription medicines only as told by your health care provider. If you were prescribed an antibiotic medicine, take it as told by your health care provider. Do not stop using the antibiotic even if you start to feel better. General instructions  Wash your hands often using soap and water. If soap and water are not available, use a hand sanitizer. Others in the household should wash their hands as well. Hands should be washed: After using the toilet or changing a diaper. Before preparing, cooking, or serving food. While caring for a sick person or while visiting someone in a hospital. Drink enough fluid to keep your urine pale yellow. Rest at home while you recover. Watch your condition for any changes. Take a warm bath to relieve any burning or pain from frequent diarrhea episodes. Keep all follow-up visits as told by your health care provider. This is important. Contact a health care provider if: You have a fever. Your diarrhea gets worse. You have new symptoms. You cannot keep fluids down. You feel light-headed or dizzy. You have a headache. You have muscle cramps. Get help right away if: You have chest pain. You feel extremely weak or you faint. You have bloody or black stools or stools that look like tar. You have severe pain, cramping, or bloating in your abdomen. You have trouble breathing or you are breathing very quickly. Your heart is beating very quickly. Your skin feels cold and clammy. You feel confused. You have signs of dehydration, such as: Dark urine, very little urine, or no urine. Cracked lips. Dry mouth. Sunken eyes. Sleepiness. Weakness. Summary Diarrhea is frequent loose and sometimes watery bowel movements. Diarrhea can make you feel weak and cause you to become dehydrated. Drink enough fluids to keep your urine pale yellow. Make sure that you wash your hands after using the  toilet. If soap and water are not available, use hand sanitizer. Contact a health care provider if your diarrhea gets worse or you have new symptoms. Get help right away if you have signs of dehydration. This information is not intended to replace advice given to you by your health care provider. Make sure you discuss any questions you have with your health care provider. Document Revised: 07/31/2020 Document Reviewed: 07/31/2020 Elsevier Patient Education  2022 [employer].    If you have been instructed to have an in-person evaluation today at a local Urgent Care facility, please use the link below. It will take you to a list of all of our available [facility], including address, phone number and hours of operation. Please do not delay care.  [facility]  If you or a family member do not have a primary care provider, use the link below to schedule a visit and establish care. When you choose a  [facility] primary care physician or advanced practice provider, you gain a long-term partner in health. Find a Primary Care Provider  Learn more about [facility]'s in-office and virtual care options: [facility]

## 2021-02-28 NOTE — Progress Notes (Signed)
Virtual Visit Consent   Ceriyah Nearing Shaffer, you are scheduled for a virtual visit with a [facility] provider today.     Just as with appointments in the office, your consent must be obtained to participate.  Your consent will be active for this visit and any virtual visit you may have with one of our providers in the next 365 days.     If you have a MyChart account, a copy of this consent can be sent to you electronically.  All virtual visits are billed to your insurance company just like a traditional visit in the office.    As this is a virtual visit, video technology does not allow for your provider to perform a traditional examination.  This may limit your provider's ability to fully assess your condition.  If your provider identifies any concerns that need to be evaluated in person or the need to arrange testing (such as labs, EKG, etc.), we will make arrangements to do so.     Although advances in technology are sophisticated, we cannot ensure that it will always work on either your end or our end.  If the connection with a video visit is poor, the visit may have to be switched to a telephone visit.  With either a video or telephone visit, we are not always able to ensure that we have a secure connection.     I need to obtain your verbal consent now.   Are you willing to proceed with your visit today?    Zanib Duane Kading has provided verbal consent on 02/28/2021 for a virtual visit (video or telephone).   Mar Daring, PA-C   Date: 02/28/2021 4:51 PM   Virtual Visit via Video Note   I, Mar Daring, connected with  Anne Shaffer  ([id number], 1985/04/23) on 02/28/21 at  4:45 PM EST by a video-enabled telemedicine application and verified that I am speaking with the correct person using two identifiers.  Location: Patient: Virtual Visit Location Patient: Home Provider: Virtual Visit Location Provider: Home Office   I discussed the limitations of evaluation and management by  telemedicine and the availability of in person appointments. The patient expressed understanding and agreed to proceed.    History of Present Illness: Anne Shaffer is a 36 y.o. who identifies as a female who was assigned female at birth, and is being seen today for diarrhea and fever.  HPI: Diarrhea  This is a new problem. The current episode started today. The problem occurs 5 to 10 times per day. The problem has been unchanged. The stool consistency is described as Watery. The patient states that diarrhea awakens her from sleep. Associated symptoms include abdominal pain, bloating, a fever (100.2) and headaches (migraine yesterday). Pertinent negatives include no chills, myalgias or vomiting. Associated symptoms comments: Fatigue, lack of appetite, nausea. Nothing aggravates the symptoms. Risk factors include ill contacts. She has tried increased fluids (tylenol) for the symptoms. The treatment provided mild relief.   Covid 19 testing is negative at home   Problems:  Patient Active Problem List   Diagnosis Date Noted   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   Migraine without aura and without status migrainosus, not intractable 11/01/2020   Encounter to establish care 05/31/2020   Moderate persistent asthma 05/31/2020   Post-COVID syndrome 05/31/2020   Essential hypertension    Anxiety 03/18/2018   Class 3 severe obesity due to excess calories without serious comorbidity with body mass index (BMI) of  50.0 to 59.9 in adult [facility]) 03/18/2018   THYROID NODULE 12/03/2008    Allergies:  Allergies  Allergen Reactions   Penicillins Itching   Shellfish Allergy    Tomato Anxiety, Hives, Itching and Swelling   Shellfish Allergy    Medications:  Current Outpatient Medications:    ciprofloxacin (CIPRO) 500 MG tablet, Take 1 tablet (500 mg total) by mouth 2 (two) times daily for 7 days., Disp: 14 tablet, Rfl: 0   ondansetron (ZOFRAN) 4 MG tablet, Take 1 tablet (4 mg total) by mouth  every 8 (eight) hours as needed for nausea or vomiting., Disp: 20 tablet, Rfl: 0   albuterol (VENTOLIN HFA) 108 (90 Base) MCG/ACT inhaler, INHALE 1 TO 2 PUFFS INTO THE LUNGS EVERY 4 HOURS AS NEEDED FOR WHEEZING OR SHORTNESS OF BREATH, Disp: 6.7 g, Rfl: 5   amLODipine (NORVASC) 5 MG tablet, TAKE 1 TABLET(5 MG) BY MOUTH DAILY, Disp: 90 tablet, Rfl: 0   hydrochlorothiazide (HYDRODIURIL) 25 MG tablet, TAKE 1 TABLET BY MOUTH EVERY DAY, Disp: 90 tablet, Rfl: 0   montelukast (SINGULAIR) 10 MG tablet, TAKE 1 TABLET(10 MG) BY MOUTH AT BEDTIME, Disp: 30 tablet, Rfl: 0   predniSONE (DELTASONE) 50 MG tablet, Take 1 tablet (50 mg total) by mouth daily., Disp: 5 tablet, Rfl: 0   Rimegepant Sulfate (NURTEC) 75 MG TBDP, Take 75 mg by mouth as needed (Migraine)., Disp: 8 tablet, Rfl: 11   sertraline (ZOLOFT) 50 MG tablet, Take 1 tablet (50 mg total) by mouth daily., Disp: 30 tablet, Rfl: 3   traZODone (DESYREL) 50 MG tablet, Take 0.5-1 tablets (25-50 mg total) by mouth at bedtime as needed for sleep., Disp: 30 tablet, Rfl: 3   Vitamin D, Ergocalciferol, (DRISDOL) 1.25 MG (50000 UNIT) CAPS capsule, TAKE 1 CAPSULE BY MOUTH EVERY 7 DAYS, Disp: 12 capsule, Rfl: 1  Observations/Objective: Patient is well-developed, well-nourished in no acute distress.  Resting comfortably at home.  Head is normocephalic, atraumatic.  No labored breathing.  Speech is clear and coherent with logical content.  Patient is alert and oriented at baseline.    Assessment and Plan: 1. Diarrhea of presumed infectious origin - ciprofloxacin (CIPRO) 500 MG tablet; Take 1 tablet (500 mg total) by mouth 2 (two) times daily for 7 days.  Dispense: 14 tablet; Refill: 0 - ondansetron (ZOFRAN) 4 MG tablet; Take 1 tablet (4 mg total) by mouth every 8 (eight) hours as needed for nausea or vomiting.  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 The patient was provided an opportunity to ask questions and all were answered. The patient agreed with the plan and demonstrated an understanding of the instructions.  A copy of instructions were sent to the patient via MyChart unless otherwise noted below.    The patient was advised to call back or seek an in-person evaluation if the symptoms worsen or if the condition fails to improve as anticipated.  Time:  I spent 13 minutes with the patient via telehealth technology discussing the above problems/concerns.    Mar Daring, PA-C

## 2021-03-09 ENCOUNTER — Telehealth: Payer: 59 | Admitting: Nurse Practitioner

## 2021-03-09 DIAGNOSIS — J4541 Moderate persistent asthma with (acute) exacerbation: Secondary | ICD-10-CM

## 2021-03-09 MED ORDER — PREDNISONE 20 MG PO TABS: 40.0000 mg | ORAL_TABLET | Freq: Every day | ORAL | 0 refills | Status: AC

## 2021-03-09 NOTE — Progress Notes (Signed)
Virtual Visit Consent   Anne Shaffer, you are scheduled for a virtual visit with a [facility] provider today.     Just as with appointments in the office, your consent must be obtained to participate.  Your consent will be active for this visit and any virtual visit you may have with one of our providers in the next 365 days.     If you have a MyChart account, a copy of this consent can be sent to you electronically.  All virtual visits are billed to your insurance company just like a traditional visit in the office.    As this is a virtual visit, video technology does not allow for your provider to perform a traditional examination.  This may limit your provider's ability to fully assess your condition.  If your provider identifies any concerns that need to be evaluated in person or the need to arrange testing (such as labs, EKG, etc.), we will make arrangements to do so.     Although advances in technology are sophisticated, we cannot ensure that it will always work on either your end or our end.  If the connection with a video visit is poor, the visit may have to be switched to a telephone visit.  With either a video or telephone visit, we are not always able to ensure that we have a secure connection.     I need to obtain your verbal consent now.   Are you willing to proceed with your visit today?    Shaivi Rothschild Tu has provided verbal consent on 03/09/2021 for a virtual visit (video or telephone).   Claiborne Rigg, NP   Date: 03/09/2021 6:40 PM   Virtual Visit via Video Note   I, Claiborne Rigg, connected with  Anne Shaffer  ([id number], 1985/05/06) on 03/09/21 at  6:15 PM EST by a video-enabled telemedicine application and verified that I am speaking with the correct person using two identifiers.  Location: Patient: Virtual Visit Location Patient: Home Provider: Virtual Visit Location Provider: Home Office   I discussed the limitations of evaluation and management by telemedicine  and the availability of in person appointments. The patient expressed understanding and agreed to proceed.    History of Present Illness: Anne Shaffer is a 36 y.o. who identifies as a female who was assigned female at birth, and is being seen today for Headache and asthma.  Endorses persistent dry cough over the past few days. She is using her albuterol inhaler and taking Singulair as prescribed with little relief. Associated symptoms include increased shortness of breath. It was recommended to her by her prescriber that she use Trilogy however her insurance would not cover this.   She also endorses persistent migraine headaches and does not feel the Nurtec that as prescribed for her back in October is effective. She will speak with her PCP regarding this.   Problems:  Patient Active Problem List   Diagnosis Date Noted   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   Migraine without aura and without status migrainosus, not intractable 11/01/2020   Encounter to establish care 05/31/2020   Moderate persistent asthma 05/31/2020   Post-COVID syndrome 05/31/2020   Essential hypertension    Anxiety 03/18/2018   Class 3 severe obesity due to excess calories without serious comorbidity with body mass index (BMI) of 50.0 to 59.9 in adult [facility]) 03/18/2018   THYROID NODULE 12/03/2008    Allergies:  Allergies  Allergen Reactions  Penicillins Itching   Shellfish Allergy    Tomato Anxiety, Hives, Itching and Swelling   Shellfish Allergy    Medications:  Current Outpatient Medications:    predniSONE (DELTASONE) 20 MG tablet, Take 2 tablets (40 mg total) by mouth daily with breakfast for 7 days., Disp: 14 tablet, Rfl: 0   albuterol (VENTOLIN HFA) 108 (90 Base) MCG/ACT inhaler, INHALE 1 TO 2 PUFFS INTO THE LUNGS EVERY 4 HOURS AS NEEDED FOR WHEEZING OR SHORTNESS OF BREATH, Disp: 6.7 g, Rfl: 5   amLODipine (NORVASC) 5 MG tablet, TAKE 1 TABLET(5 MG) BY MOUTH DAILY, Disp: 90 tablet, Rfl: 0    hydrochlorothiazide (HYDRODIURIL) 25 MG tablet, TAKE 1 TABLET BY MOUTH EVERY DAY, Disp: 90 tablet, Rfl: 0   montelukast (SINGULAIR) 10 MG tablet, TAKE 1 TABLET(10 MG) BY MOUTH AT BEDTIME, Disp: 30 tablet, Rfl: 0   ondansetron (ZOFRAN) 4 MG tablet, Take 1 tablet (4 mg total) by mouth every 8 (eight) hours as needed for nausea or vomiting., Disp: 20 tablet, Rfl: 0   Rimegepant Sulfate (NURTEC) 75 MG TBDP, Take 75 mg by mouth as needed (Migraine)., Disp: 8 tablet, Rfl: 11   sertraline (ZOLOFT) 50 MG tablet, Take 1 tablet (50 mg total) by mouth daily., Disp: 30 tablet, Rfl: 3   traZODone (DESYREL) 50 MG tablet, Take 0.5-1 tablets (25-50 mg total) by mouth at bedtime as needed for sleep., Disp: 30 tablet, Rfl: 3   Vitamin D, Ergocalciferol, (DRISDOL) 1.25 MG (50000 UNIT) CAPS capsule, TAKE 1 CAPSULE BY MOUTH EVERY 7 DAYS, Disp: 12 capsule, Rfl: 1  Observations/Objective: Patient is well-developed, well-nourished in no acute distress.  Resting comfortably at home.  Head is normocephalic, atraumatic.  No labored breathing.  Speech is clear and coherent with logical content.  Patient is alert and oriented at baseline.    Assessment and Plan: 1. Moderate persistent asthma with acute exacerbation - predniSONE (DELTASONE) 20 MG tablet; Take 2 tablets (40 mg total) by mouth daily with breakfast for 7 days.  Dispense: 14 tablet; Refill: 0 Continue singulair and prn albuterol inhaler as prescribed.   Follow Up Instructions: I discussed the assessment and treatment plan with the patient. The patient was provided an opportunity to ask questions and all were answered. The patient agreed with the plan and demonstrated an understanding of the instructions.  A copy of instructions were sent to the patient via MyChart unless otherwise noted below.  The patient was advised to call back or seek an in-person evaluation if the symptoms worsen or if the condition fails to improve as anticipated.  Time:  I spent 11  minutes with the patient via telehealth technology discussing the above problems/concerns.    Claiborne Rigg, NP

## 2021-03-09 NOTE — Patient Instructions (Addendum)
°  Vermelle M Battaglia, thank you for joining Claiborne Rigg, NP for today's virtual visit.  While this provider is not your primary care provider (PCP), if your PCP is located in our provider database this encounter information will be shared with them immediately following your visit.  Consent: (Patient) Anne Shaffer provided verbal consent for this virtual visit at the beginning of the encounter.  Current Medications:  Current Outpatient Medications:    predniSONE (DELTASONE) 20 MG tablet, Take 2 tablets (40 mg total) by mouth daily with breakfast for 7 days., Disp: 14 tablet, Rfl: 0   albuterol (VENTOLIN HFA) 108 (90 Base) MCG/ACT inhaler, INHALE 1 TO 2 PUFFS INTO THE LUNGS EVERY 4 HOURS AS NEEDED FOR WHEEZING OR SHORTNESS OF BREATH, Disp: 6.7 g, Rfl: 5   amLODipine (NORVASC) 5 MG tablet, TAKE 1 TABLET(5 MG) BY MOUTH DAILY, Disp: 90 tablet, Rfl: 0   hydrochlorothiazide (HYDRODIURIL) 25 MG tablet, TAKE 1 TABLET BY MOUTH EVERY DAY, Disp: 90 tablet, Rfl: 0   montelukast (SINGULAIR) 10 MG tablet, TAKE 1 TABLET(10 MG) BY MOUTH AT BEDTIME, Disp: 30 tablet, Rfl: 0   ondansetron (ZOFRAN) 4 MG tablet, Take 1 tablet (4 mg total) by mouth every 8 (eight) hours as needed for nausea or vomiting., Disp: 20 tablet, Rfl: 0   Rimegepant Sulfate (NURTEC) 75 MG TBDP, Take 75 mg by mouth as needed (Migraine)., Disp: 8 tablet, Rfl: 11   sertraline (ZOLOFT) 50 MG tablet, Take 1 tablet (50 mg total) by mouth daily., Disp: 30 tablet, Rfl: 3   traZODone (DESYREL) 50 MG tablet, Take 0.5-1 tablets (25-50 mg total) by mouth at bedtime as needed for sleep., Disp: 30 tablet, Rfl: 3   Vitamin D, Ergocalciferol, (DRISDOL) 1.25 MG (50000 UNIT) CAPS capsule, TAKE 1 CAPSULE BY MOUTH EVERY 7 DAYS, Disp: 12 capsule, Rfl: 1   Medications ordered in this encounter:  Meds ordered this encounter  Medications   predniSONE (DELTASONE) 20 MG tablet    Sig: Take 2 tablets (40 mg total) by mouth daily with breakfast for 7 days.     Dispense:  14 tablet    Refill:  0    Order Specific Question:   Supervising Provider    Answer:   Hyacinth Meeker, BRIAN [[id number]]     *If you need refills on other medications prior to your next appointment, please contact your pharmacy*  Follow-Up: Call back or seek an in-person evaluation if the symptoms worsen or if the condition fails to improve as anticipated.  Other Instructions  Continue singulair and prn albuterol inhaler as prescribed.    If you have been instructed to have an in-person evaluation today at a local Urgent Care facility, please use the link below. It will take you to a list of all of our available [facility], including address, phone number and hours of operation. Please do not delay care.  [facility]  If you or a family member do not have a primary care provider, use the link below to schedule a visit and establish care. When you choose a [facility] primary care physician or advanced practice provider, you gain a long-term partner in health. Find a Primary Care Provider  Learn more about [facility]'s in-office and virtual care options: [facility] - Get Care Now

## 2021-03-22 ENCOUNTER — Other Ambulatory Visit (HOSPITAL_BASED_OUTPATIENT_CLINIC_OR_DEPARTMENT_OTHER): Payer: Self-pay | Admitting: Nurse Practitioner

## 2021-03-22 DIAGNOSIS — U099 Post covid-19 condition, unspecified: Secondary | ICD-10-CM

## 2021-03-22 DIAGNOSIS — J4540 Moderate persistent asthma, uncomplicated: Secondary | ICD-10-CM

## 2021-05-27 ENCOUNTER — Telehealth: Payer: 59 | Admitting: Physician Assistant

## 2021-05-27 DIAGNOSIS — J019 Acute sinusitis, unspecified: Secondary | ICD-10-CM

## 2021-05-27 DIAGNOSIS — B9689 Other specified bacterial agents as the cause of diseases classified elsewhere: Secondary | ICD-10-CM

## 2021-05-27 MED ORDER — DOXYCYCLINE HYCLATE 100 MG PO TABS: 100.0000 mg | ORAL_TABLET | Freq: Two times a day (BID) | ORAL | 0 refills | Status: DC

## 2021-05-27 MED ORDER — PREDNISONE 20 MG PO TABS: 40.0000 mg | ORAL_TABLET | Freq: Every day | ORAL | 0 refills | Status: DC

## 2021-05-27 MED ORDER — FLUTICASONE PROPIONATE 50 MCG/ACT NA SUSP: 2.0000 | Freq: Every day | NASAL | 0 refills | Status: DC

## 2021-05-27 NOTE — Patient Instructions (Signed)
?Anne Shaffer, thank you for joining Anne Rio, PA-C for today's virtual visit.  While this provider is not your primary care provider (PCP), if your PCP is located in our provider database this encounter information will be shared with them immediately following your visit. ? ?Consent: ?(Patient) Anne Shaffer provided verbal consent for this virtual visit at the beginning of the encounter. ? ?Current Medications: ? ?Current Outpatient Medications:  ?  doxycycline (VIBRA-TABS) 100 MG tablet, Take 1 tablet (100 mg total) by mouth 2 (two) times daily., Disp: 20 tablet, Rfl: 0 ?  fluticasone (FLONASE) 50 MCG/ACT nasal spray, Place 2 sprays into both nostrils daily., Disp: 16 g, Rfl: 0 ?  predniSONE (DELTASONE) 20 MG tablet, Take 2 tablets (40 mg total) by mouth daily with breakfast., Disp: 6 tablet, Rfl: 0 ?  albuterol (VENTOLIN HFA) 108 (90 Base) MCG/ACT inhaler, INHALE 1 TO 2 PUFFS INTO THE LUNGS EVERY 4 HOURS AS NEEDED FOR WHEEZING OR SHORTNESS OF BREATH, Disp: 6.7 g, Rfl: 5 ?  amLODipine (NORVASC) 5 MG tablet, TAKE 1 TABLET(5 MG) BY MOUTH DAILY, Disp: 90 tablet, Rfl: 0 ?  hydrochlorothiazide (HYDRODIURIL) 25 MG tablet, TAKE 1 TABLET BY MOUTH EVERY DAY, Disp: 90 tablet, Rfl: 0 ?  montelukast (SINGULAIR) 10 MG tablet, TAKE 1 TABLET(10 MG) BY MOUTH AT BEDTIME, Disp: 30 tablet, Rfl: 0 ?  Rimegepant Sulfate (NURTEC) 75 MG TBDP, Take 75 mg by mouth as needed (Migraine)., Disp: 8 tablet, Rfl: 11 ?  sertraline (ZOLOFT) 50 MG tablet, Take 1 tablet (50 mg total) by mouth daily., Disp: 30 tablet, Rfl: 3 ?  traZODone (DESYREL) 50 MG tablet, Take 0.5-1 tablets (25-50 mg total) by mouth at bedtime as needed for sleep., Disp: 30 tablet, Rfl: 3 ?  Vitamin D, Ergocalciferol, (DRISDOL) 1.25 MG (50000 UNIT) CAPS capsule, TAKE 1 CAPSULE BY MOUTH EVERY 7 DAYS, Disp: 12 capsule, Rfl: 1  ? ?Medications ordered in this encounter:  ?Meds ordered this encounter  ?Medications  ? doxycycline (VIBRA-TABS) 100 MG tablet  ?  Sig:  Take 1 tablet (100 mg total) by mouth 2 (two) times daily.  ?  Dispense:  20 tablet  ?  Refill:  0  ?  Order Specific Question:   Supervising Provider  ?  Answer:   Noemi Chapel [[id number]]  ? fluticasone (FLONASE) 50 MCG/ACT nasal spray  ?  Sig: Place 2 sprays into both nostrils daily.  ?  Dispense:  16 g  ?  Refill:  0  ?  Order Specific Question:   Supervising Provider  ?  Answer:   Noemi Chapel [[id number]]  ? predniSONE (DELTASONE) 20 MG tablet  ?  Sig: Take 2 tablets (40 mg total) by mouth daily with breakfast.  ?  Dispense:  6 tablet  ?  Refill:  0  ?  Order Specific Question:   Supervising Provider  ?  Answer:   Noemi Chapel [[id number]]  ?  ? ?*If you need refills on other medications prior to your next appointment, please contact your pharmacy* ? ?Follow-Up: ?Call back or seek an in-person evaluation if the symptoms worsen or if the condition fails to improve as anticipated. ? ?Other Instructions ?Please take antibiotic as directed.  Increase fluid intake.  Use Saline nasal spray.  Take a daily multivitamin. Take the steroid and the Flonase as directed. Keep being consistent with your Singulair.  Place a humidifier in the bedroom.  Please call or return clinic if symptoms are not improving. ? ?Sinusitis ?Sinusitis  is redness, soreness, and swelling (inflammation) of the paranasal sinuses. Paranasal sinuses are air pockets within the bones of your face (beneath the eyes, the middle of the forehead, or above the eyes). In healthy paranasal sinuses, mucus is able to drain out, and air is able to circulate through them by way of your nose. However, when your paranasal sinuses are inflamed, mucus and air can become trapped. This can allow bacteria and other germs to grow and cause infection. ?Sinusitis can develop quickly and last only a short time (acute) or continue over a long period (chronic). Sinusitis that lasts for more than 12 weeks is considered chronic.  ?CAUSES  ?Causes of sinusitis  include: ?Allergies. ?Structural abnormalities, such as displacement of the cartilage that separates your nostrils (deviated septum), which can decrease the air flow through your nose and sinuses and affect sinus drainage. ?Functional abnormalities, such as when the small hairs (cilia) that line your sinuses and help remove mucus do not work properly or are not present. ?SYMPTOMS  ?Symptoms of acute and chronic sinusitis are the same. The primary symptoms are pain and pressure around the affected sinuses. Other symptoms include: ?Upper toothache. ?Earache. ?Headache. ?Bad breath. ?Decreased sense of smell and taste. ?A cough, which worsens when you are lying flat. ?Fatigue. ?Fever. ?Thick drainage from your nose, which often is green and may contain pus (purulent). ?Swelling and warmth over the affected sinuses. ?DIAGNOSIS  ?Your caregiver will perform a physical exam. During the exam, your caregiver may: ?Look in your nose for signs of abnormal growths in your nostrils (nasal polyps). ?Tap over the affected sinus to check for signs of infection. ?View the inside of your sinuses (endoscopy) with a special imaging device with a light attached (endoscope), which is inserted into your sinuses. ?If your caregiver suspects that you have chronic sinusitis, one or more of the following tests may be recommended: ?Allergy tests. ?Nasal culture A sample of mucus is taken from your nose and sent to a lab and screened for bacteria. ?Nasal cytology A sample of mucus is taken from your nose and examined by your caregiver to determine if your sinusitis is related to an allergy. ?TREATMENT  ?Most cases of acute sinusitis are related to a viral infection and will resolve on their own within 10 days. Sometimes medicines are prescribed to help relieve symptoms (pain medicine, decongestants, nasal steroid sprays, or saline sprays).  ?However, for sinusitis related to a bacterial infection, your caregiver will prescribe antibiotic  medicines. These are medicines that will help kill the bacteria causing the infection.  ?Rarely, sinusitis is caused by a fungal infection. In theses cases, your caregiver will prescribe antifungal medicine. ?For some cases of chronic sinusitis, surgery is needed. Generally, these are cases in which sinusitis recurs more than 3 times per year, despite other treatments. ?HOME CARE INSTRUCTIONS  ?Drink plenty of water. Water helps thin the mucus so your sinuses can drain more easily. ?Use a humidifier. ?Inhale steam 3 to 4 times a day (for example, sit in the bathroom with the shower running). ?Apply a warm, moist washcloth to your face 3 to 4 times a day, or as directed by your caregiver. ?Use saline nasal sprays to help moisten and clean your sinuses. ?Take over-the-counter or prescription medicines for pain, discomfort, or fever only as directed by your caregiver. ?SEEK IMMEDIATE MEDICAL CARE IF: ?You have increasing pain or severe headaches. ?You have nausea, vomiting, or drowsiness. ?You have swelling around your face. ?You have vision problems. ?You  have a stiff neck. ?You have difficulty breathing. ?MAKE SURE YOU:  ?Understand these instructions. ?Will watch your condition. ?Will get help right away if you are not doing well or get worse. ?Document Released: 01/19/2005 Document Revised: 04/13/2011 Document Reviewed: 02/03/2011 ?ExitCare? Patient Information ?2014 [place], [place]. ? ? ? ?If you have been instructed to have an in-person evaluation today at a local Urgent Care facility, please use the link below. It will take you to a list of all of our available [facility], including address, phone number and hours of operation. Please do not delay care.  ?[facility] ? ?If you or a family member do not have a primary care provider, use the link below to schedule a visit and establish care. When you choose a [facility] primary care physician or advanced practice provider, you gain a  long-term partner in health. ?Find a Primary Care Provider ? ?Learn more about [facility]'s in-office and virtual care options: ?[facility]  ?

## 2021-05-27 NOTE — Progress Notes (Signed)
?Virtual Visit Consent  ? ?Anne Shaffer, you are scheduled for a virtual visit with a [facility] provider today.   ?  ?Just as with appointments in the office, your consent must be obtained to participate.  Your consent will be active for this visit and any virtual visit you may have with one of our providers in the next 365 days.   ?  ?If you have a MyChart account, a copy of this consent can be sent to you electronically.  All virtual visits are billed to your insurance company just like a traditional visit in the office.   ? ?As this is a virtual visit, video technology does not allow for your provider to perform a traditional examination.  This may limit your provider's ability to fully assess your condition.  If your provider identifies any concerns that need to be evaluated in person or the need to arrange testing (such as labs, EKG, etc.), we will make arrangements to do so.   ?  ?Although advances in technology are sophisticated, we cannot ensure that it will always work on either your end or our end.  If the connection with a video visit is poor, the visit may have to be switched to a telephone visit.  With either a video or telephone visit, we are not always able to ensure that we have a secure connection.    ? ?Also, by engaging in this virtual visit, you consent to the provision of healthcare. Additionally, you authorize for your insurance to be billed (if applicable) for the services provided during this visit.  ? ?I need to obtain your verbal consent now.   Are you willing to proceed with your visit today?  ?  ?Anne Shaffer has provided verbal consent on 05/27/2021 for a virtual visit (video or telephone). ?  ?Anne Climes, PA-C  ? ?Date: 05/27/2021 2:53 PM ? ? ?Virtual Visit via Video Note  ? ?IPiedad Shaffer, connected with  Anne Shaffer  ([id number], March 12, 1985) on 05/27/21 at  2:45 PM EDT by a video-enabled telemedicine application and verified that I am speaking with the correct  person using two identifiers. ? ?Location: ?Patient: Virtual Visit Location Patient: Home ?Provider: Virtual Visit Location Provider: Home Office ?  ?I discussed the limitations of evaluation and management by telemedicine and the availability of in person appointments. The patient expressed understanding and agreed to proceed.   ? ?History of Present Illness: ?Anne Shaffer is a 36 y.o. who identifies as a female who was assigned female at birth, and is being seen today for abrupt increase in her allergy/sinus symptoms over the weekend with maxillary sinus pressure and pain now. Has been consistent with her allergy medications for seasonal allergies. Denies fever, chills, aches. Denies change in overall breathing or increased need for albuterol. Denies recent travel or sick contact.  ? ?Also noting some stress after loss of her grandfather this past month.  ? ?HPI: HPI  ?Problems:  ?Patient Active Problem List  ? Diagnosis Date Noted  ? 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 Migraine without aura and without status migrainosus, not intractable 11/01/2020  ? Encounter to establish care 05/31/2020  ? Moderate persistent asthma 05/31/2020  ? Post-COVID syndrome 05/31/2020  ? Essential hypertension   ? Anxiety 03/18/2018  ? Class 3 severe obesity due to excess calories without serious comorbidity with body mass index (BMI) of 50.0 to 59.9 in adult [facility]) 03/18/2018  ? THYROID NODULE  12/03/2008  ?  ?Allergies:  ?Allergies  ?Allergen Reactions  ? Penicillins Itching  ? Shellfish Allergy   ? Tomato Anxiety, Hives, Itching and Swelling  ? Shellfish Allergy   ? ?Medications:  ?Current Outpatient Medications:  ?  doxycycline (VIBRA-TABS) 100 MG tablet, Take 1 tablet (100 mg total) by mouth 2 (two) times daily., Disp: 20 tablet, Rfl: 0 ?  fluticasone (FLONASE) 50 MCG/ACT nasal spray, Place 2 sprays into both nostrils daily., Disp: 16 g, Rfl: 0 ?  predniSONE (DELTASONE) 20 MG tablet, Take 2 tablets (40 mg total)  by mouth daily with breakfast., Disp: 6 tablet, Rfl: 0 ?  albuterol (VENTOLIN HFA) 108 (90 Base) MCG/ACT inhaler, INHALE 1 TO 2 PUFFS INTO THE LUNGS EVERY 4 HOURS AS NEEDED FOR WHEEZING OR SHORTNESS OF BREATH, Disp: 6.7 g, Rfl: 5 ?  amLODipine (NORVASC) 5 MG tablet, TAKE 1 TABLET(5 MG) BY MOUTH DAILY, Disp: 90 tablet, Rfl: 0 ?  hydrochlorothiazide (HYDRODIURIL) 25 MG tablet, TAKE 1 TABLET BY MOUTH EVERY DAY, Disp: 90 tablet, Rfl: 0 ?  montelukast (SINGULAIR) 10 MG tablet, TAKE 1 TABLET(10 MG) BY MOUTH AT BEDTIME, Disp: 30 tablet, Rfl: 0 ?  Rimegepant Sulfate (NURTEC) 75 MG TBDP, Take 75 mg by mouth as needed (Migraine)., Disp: 8 tablet, Rfl: 11 ?  sertraline (ZOLOFT) 50 MG tablet, Take 1 tablet (50 mg total) by mouth daily., Disp: 30 tablet, Rfl: 3 ?  traZODone (DESYREL) 50 MG tablet, Take 0.5-1 tablets (25-50 mg total) by mouth at bedtime as needed for sleep., Disp: 30 tablet, Rfl: 3 ?  Vitamin D, Ergocalciferol, (DRISDOL) 1.25 MG (50000 UNIT) CAPS capsule, TAKE 1 CAPSULE BY MOUTH EVERY 7 DAYS, Disp: 12 capsule, Rfl: 1 ? ?Observations/Objective: ?Patient is well-developed, well-nourished in no acute distress.  ?Resting comfortably at home.  ?Head is normocephalic, atraumatic.  ?No labored breathing. ?Speech is clear and coherent with logical content.  ?Patient is alert and oriented at baseline.  ? ?Assessment and Plan: ?1. Acute bacterial sinusitis ?- doxycycline (VIBRA-TABS) 100 MG tablet; Take 1 tablet (100 mg total) by mouth 2 (two) times daily.  Dispense: 20 tablet; Refill: 0 ?- fluticasone (FLONASE) 50 MCG/ACT nasal spray; Place 2 sprays into both nostrils daily.  Dispense: 16 g; Refill: 0 ?- predniSONE (DELTASONE) 20 MG tablet; Take 2 tablets (40 mg total) by mouth daily with breakfast.  Dispense: 6 tablet; Refill: 0 ? ?Patient denies concern for pregnancy. Rx Doxycycline due to penicillin allergy.  Increase fluids.  Rest.  Saline nasal spray.  Probiotic.  Mucinex as directed.  Humidifier in bedroom. Start  Flonase and restart Singulair. Will give 3-day burst of prednisone for sinus inflammation and pain since more severe.   Call or return to clinic if symptoms are not improving. ? ? ?Follow Up Instructions: ?I discussed the assessment and treatment plan with the patient. The patient was provided an opportunity to ask questions and all were answered. The patient agreed with the plan and demonstrated an understanding of the instructions.  A copy of instructions were sent to the patient via MyChart unless otherwise noted below.  ? ? ? ?The patient was advised to call back or seek an in-person evaluation if the symptoms worsen or if the condition fails to improve as anticipated. ? ?Time:  ?I spent 10 minutes with the patient via telehealth technology discussing the above problems/concerns.   ? ?Anne Climes, PA-C ?

## 2021-06-17 ENCOUNTER — Other Ambulatory Visit (HOSPITAL_BASED_OUTPATIENT_CLINIC_OR_DEPARTMENT_OTHER): Payer: Self-pay | Admitting: Nurse Practitioner

## 2021-06-17 DIAGNOSIS — J4540 Moderate persistent asthma, uncomplicated: Secondary | ICD-10-CM

## 2021-06-17 DIAGNOSIS — U099 Post covid-19 condition, unspecified: Secondary | ICD-10-CM

## 2021-07-24 ENCOUNTER — Telehealth: Payer: 59 | Admitting: Physician Assistant

## 2021-07-24 DIAGNOSIS — J208 Acute bronchitis due to other specified organisms: Secondary | ICD-10-CM

## 2021-07-24 DIAGNOSIS — B9689 Other specified bacterial agents as the cause of diseases classified elsewhere: Secondary | ICD-10-CM | POA: Diagnosis not present

## 2021-07-24 MED ORDER — BENZONATATE 100 MG PO CAPS: 100.0000 mg | ORAL_CAPSULE | Freq: Three times a day (TID) | ORAL | 0 refills | Status: DC | PRN

## 2021-07-24 MED ORDER — AZITHROMYCIN 250 MG PO TABS
ORAL_TABLET | ORAL | 0 refills | Status: AC
Start: 2021-07-24 — End: 2021-07-29

## 2021-07-24 MED ORDER — PREDNISONE 20 MG PO TABS: 40.0000 mg | ORAL_TABLET | Freq: Every day | ORAL | 0 refills | Status: AC

## 2021-07-24 NOTE — Patient Instructions (Signed)
Anne Shaffer, thank you for joining [name], PA-C for today's virtual visit.  While this provider is not your primary care provider (PCP), if your PCP is located in our provider database this encounter information will be shared with them immediately following your visit.  Consent: (Patient) Anne Shaffer provided verbal consent for this virtual visit at the beginning of the encounter.  Current Medications:  Current Outpatient Medications:    albuterol (VENTOLIN HFA) 108 (90 Base) MCG/ACT inhaler, INHALE 1 TO 2 PUFFS INTO THE LUNGS EVERY 4 HOURS AS NEEDED FOR WHEEZING OR SHORTNESS OF BREATH, Disp: 6.7 g, Rfl: 5   amLODipine (NORVASC) 5 MG tablet, TAKE 1 TABLET(5 MG) BY MOUTH DAILY, Disp: 90 tablet, Rfl: 0   doxycycline (VIBRA-TABS) 100 MG tablet, Take 1 tablet (100 mg total) by mouth 2 (two) times daily., Disp: 20 tablet, Rfl: 0   fluticasone (FLONASE) 50 MCG/ACT nasal spray, Place 2 sprays into both nostrils daily., Disp: 16 g, Rfl: 0   hydrochlorothiazide (HYDRODIURIL) 25 MG tablet, TAKE 1 TABLET BY MOUTH EVERY DAY, Disp: 90 tablet, Rfl: 0   montelukast (SINGULAIR) 10 MG tablet, TAKE 1 TABLET(10 MG) BY MOUTH AT BEDTIME, Disp: 30 tablet, Rfl: 0   predniSONE (DELTASONE) 20 MG tablet, Take 2 tablets (40 mg total) by mouth daily with breakfast., Disp: 6 tablet, Rfl: 0   Rimegepant Sulfate (NURTEC) 75 MG TBDP, Take 75 mg by mouth as needed (Migraine)., Disp: 8 tablet, Rfl: 11   sertraline (ZOLOFT) 50 MG tablet, Take 1 tablet (50 mg total) by mouth daily., Disp: 30 tablet, Rfl: 3   traZODone (DESYREL) 50 MG tablet, Take 0.5-1 tablets (25-50 mg total) by mouth at bedtime as needed for sleep., Disp: 30 tablet, Rfl: 3   Vitamin D, Ergocalciferol, (DRISDOL) 1.25 MG (50000 UNIT) CAPS capsule, TAKE 1 CAPSULE BY MOUTH EVERY 7 DAYS, Disp: 12 capsule, Rfl: 1   Medications ordered in this encounter:  No orders of the defined types were placed in this encounter.    *If you need refills on  other medications prior to your next appointment, please contact your pharmacy*  Follow-Up: Call back or seek an in-person evaluation if the symptoms worsen or if the condition fails to improve as anticipated.  Other Instructions Take antibiotic (Azithromycin) as directed.  Increase fluids.  Get plenty of rest. Use Mucinex for congestion. Start the Tessalon and prednisone as directed.. Take a daily probiotic (I recommend Align or Culturelle, but even Activia Yogurt may be beneficial).  A humidifier placed in the bedroom may offer some relief for a dry, scratchy throat of nasal irritation.  Read information below on acute bronchitis. Please call or return to clinic if symptoms are not improving.  Acute Bronchitis Bronchitis is when the airways that extend from the windpipe into the lungs get red, puffy, and painful (inflamed). Bronchitis often causes thick spit (mucus) to develop. This leads to a cough. A cough is the most common symptom of bronchitis. In acute bronchitis, the condition usually begins suddenly and goes away over time (usually in 2 weeks). Smoking, allergies, and asthma can make bronchitis worse. Repeated episodes of bronchitis may cause more lung problems.  HOME CARE Rest. Drink enough fluids to keep your pee (urine) clear or pale yellow (unless you need to limit fluids as told by your doctor). Only take over-the-counter or prescription medicines as told by your doctor. Avoid smoking and secondhand smoke. These can make bronchitis worse. If you are a smoker, think about  using nicotine gum or skin patches. Quitting smoking will help your lungs heal faster. Reduce the chance of getting bronchitis again by: Washing your hands often. Avoiding people with cold symptoms. Trying not to touch your hands to your mouth, nose, or eyes. Follow up with your doctor as told.  GET HELP IF: Your symptoms do not improve after 1 week of treatment. Symptoms include: Cough. Fever. Coughing up  thick spit. Body aches. Chest congestion. Chills. Shortness of breath. Sore throat.  GET HELP RIGHT AWAY IF:  You have an increased fever. You have chills. You have severe shortness of breath. You have bloody thick spit (sputum). You throw up (vomit) often. You lose too much body fluid (dehydration). You have a severe headache. You faint.  MAKE SURE YOU:  Understand these instructions. Will watch your condition. Will get help right away if you are not doing well or get worse. Document Released: 07/08/2007 Document Revised: 09/21/2012 Document Reviewed: 07/12/2012 [facility] Patient Information 2015 [place], [place]. This information is not intended to replace advice given to you by your health care provider. Make sure you discuss any questions you have with your health care provider.    If you have been instructed to have an in-person evaluation today at a local Urgent Care facility, please use the link below. It will take you to a list of all of our available [facility], including address, phone number and hours of operation. Please do not delay care.  [facility]  If you or a family member do not have a primary care provider, use the link below to schedule a visit and establish care. When you choose a [facility] primary care physician or advanced practice provider, you gain a long-term partner in health. Find a Primary Care Provider  Learn more about [facility]'s in-office and virtual care options: [facility] - Get Care Now

## 2021-08-17 ENCOUNTER — Other Ambulatory Visit (HOSPITAL_BASED_OUTPATIENT_CLINIC_OR_DEPARTMENT_OTHER): Payer: Self-pay | Admitting: Nurse Practitioner

## 2021-08-17 DIAGNOSIS — U099 Post covid-19 condition, unspecified: Secondary | ICD-10-CM

## 2021-08-17 DIAGNOSIS — J4540 Moderate persistent asthma, uncomplicated: Secondary | ICD-10-CM

## 2021-08-20 ENCOUNTER — Other Ambulatory Visit (HOSPITAL_BASED_OUTPATIENT_CLINIC_OR_DEPARTMENT_OTHER): Payer: Self-pay | Admitting: Nurse Practitioner

## 2021-08-20 DIAGNOSIS — U099 Post covid-19 condition, unspecified: Secondary | ICD-10-CM

## 2021-08-20 DIAGNOSIS — J4540 Moderate persistent asthma, uncomplicated: Secondary | ICD-10-CM

## 2021-08-20 MED ORDER — MONTELUKAST SODIUM 10 MG PO TABS: ORAL_TABLET | ORAL | 3 refills | Status: DC

## 2021-10-29 ENCOUNTER — Telehealth: Payer: 59 | Admitting: Physician Assistant

## 2021-10-29 DIAGNOSIS — B9689 Other specified bacterial agents as the cause of diseases classified elsewhere: Secondary | ICD-10-CM | POA: Diagnosis not present

## 2021-10-29 DIAGNOSIS — U099 Post covid-19 condition, unspecified: Secondary | ICD-10-CM

## 2021-10-29 DIAGNOSIS — J4540 Moderate persistent asthma, uncomplicated: Secondary | ICD-10-CM | POA: Diagnosis not present

## 2021-10-29 DIAGNOSIS — J208 Acute bronchitis due to other specified organisms: Secondary | ICD-10-CM | POA: Diagnosis not present

## 2021-10-29 MED ORDER — BENZONATATE 100 MG PO CAPS: 100.0000 mg | ORAL_CAPSULE | Freq: Three times a day (TID) | ORAL | 0 refills | Status: DC | PRN

## 2021-10-29 MED ORDER — ALBUTEROL SULFATE HFA 108 (90 BASE) MCG/ACT IN AERS: INHALATION_SPRAY | RESPIRATORY_TRACT | 0 refills | Status: DC

## 2021-10-29 MED ORDER — AZITHROMYCIN 250 MG PO TABS: ORAL_TABLET | ORAL | 0 refills | Status: DC

## 2021-10-29 MED ORDER — MONTELUKAST SODIUM 10 MG PO TABS: ORAL_TABLET | ORAL | 0 refills | Status: DC

## 2021-10-29 MED ORDER — PREDNISONE 20 MG PO TABS: 40.0000 mg | ORAL_TABLET | Freq: Every day | ORAL | 0 refills | Status: DC

## 2021-10-29 MED ORDER — PROMETHAZINE-DM 6.25-15 MG/5ML PO SYRP: 5.0000 mL | ORAL_SOLUTION | Freq: Four times a day (QID) | ORAL | 0 refills | Status: DC | PRN

## 2021-10-29 MED ORDER — AZITHROMYCIN 250 MG PO TABS: ORAL_TABLET | ORAL | 0 refills | Status: AC

## 2021-10-29 NOTE — Addendum Note (Signed)
Addended by: Mar Daring on: 10/29/2021 11:58 AM   Modules accepted: Orders

## 2021-10-29 NOTE — Progress Notes (Signed)
Virtual Visit Consent   Anne Shaffer, you are scheduled for a virtual visit with a [facility] provider today. Just as with appointments in the office, your consent must be obtained to participate. Your consent will be active for this visit and any virtual visit you may have with one of our providers in the next 365 days. If you have a MyChart account, a copy of this consent can be sent to you electronically.  As this is a virtual visit, video technology does not allow for your provider to perform a traditional examination. This may limit your provider's ability to fully assess your condition. If your provider identifies any concerns that need to be evaluated in person or the need to arrange testing (such as labs, EKG, etc.), we will make arrangements to do so. Although advances in technology are sophisticated, we cannot ensure that it will always work on either your end or our end. If the connection with a video visit is poor, the visit may have to be switched to a telephone visit. With either a video or telephone visit, we are not always able to ensure that we have a secure connection.  By engaging in this virtual visit, you consent to the provision of healthcare and authorize for your insurance to be billed (if applicable) for the services provided during this visit. Depending on your insurance coverage, you may receive a charge related to this service.  I need to obtain your verbal consent now. Are you willing to proceed with your visit today? Anne Shaffer has provided verbal consent on 10/29/2021 for a virtual visit (video or telephone). Anne Loveless, PA-C  Date: 10/29/2021 9:46 AM  Virtual Visit via Video Note   I, Anne Shaffer, connected with  Anne Shaffer  ([id number], 07-Jun-1985) on 10/29/21 at  9:30 AM EDT by a video-enabled telemedicine application and verified that I am speaking with the correct person using two identifiers.  Location: Patient: Virtual Visit Location  Patient: Home Provider: Virtual Visit Location Provider: Home Office   I discussed the limitations of evaluation and management by telemedicine and the availability of in person appointments. The patient expressed understanding and agreed to proceed.    History of Present Illness: Anne Shaffer is a 36 y.o. who identifies as a female who was assigned female at birth, and is being seen today for worsening cough and congestion.  HPI: Cough This is a new problem. The problem has been gradually worsening. The problem occurs every few minutes. The cough is Productive of sputum and productive of purulent sputum. Associated symptoms include chest pain (tightness), a fever (low grade), headaches, myalgias, rhinorrhea, shortness of breath (asthma) and wheezing (asthma). Pertinent negatives include no chills, ear congestion, ear pain, nasal congestion, postnasal drip or sore throat. The symptoms are aggravated by lying down. She has tried a beta-agonist inhaler for the symptoms. The treatment provided no relief. Her past medical history is significant for asthma and bronchitis. There is no history of pneumonia.  PMH positive for asthma    Problems:  Patient Active Problem List   Diagnosis Date Noted   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   Migraine without aura and without status migrainosus, not intractable 11/01/2020   Encounter to establish care 05/31/2020   Moderate persistent asthma 05/31/2020   Post-COVID syndrome 05/31/2020   Essential hypertension    Anxiety 03/18/2018   Class 3 severe obesity due to excess calories without serious comorbidity with body mass index (  BMI) of 50.0 to 59.9 in adult [facility]) 03/18/2018   THYROID NODULE 12/03/2008    Allergies:  Allergies  Allergen Reactions   Penicillins Itching   Shellfish Allergy    Tomato Anxiety, Hives, Itching and Swelling   Shellfish Allergy    Medications:  Current Outpatient Medications:    azithromycin (ZITHROMAX) 250  MG tablet, Take 2 tablets on day 1, then 1 tablet daily on days 2 through 5, Disp: 6 tablet, Rfl: 0   benzonatate (TESSALON) 100 MG capsule, Take 1 capsule (100 mg total) by mouth 3 (three) times daily as needed., Disp: 30 capsule, Rfl: 0   predniSONE (DELTASONE) 20 MG tablet, Take 2 tablets (40 mg total) by mouth daily with breakfast., Disp: 10 tablet, Rfl: 0   promethazine-dextromethorphan (PROMETHAZINE-DM) 6.25-15 MG/5ML syrup, Take 5 mLs by mouth 4 (four) times daily as needed., Disp: 118 mL, Rfl: 0   albuterol (VENTOLIN HFA) 108 (90 Base) MCG/ACT inhaler, INHALE 1 TO 2 PUFFS INTO THE LUNGS EVERY 4 HOURS AS NEEDED FOR WHEEZING OR SHORTNESS OF BREATH, Disp: 18 g, Rfl: 0   amLODipine (NORVASC) 5 MG tablet, TAKE 1 TABLET(5 MG) BY MOUTH DAILY, Disp: 90 tablet, Rfl: 0   hydrochlorothiazide (HYDRODIURIL) 25 MG tablet, TAKE 1 TABLET BY MOUTH EVERY DAY, Disp: 90 tablet, Rfl: 0   montelukast (SINGULAIR) 10 MG tablet, TAKE 1 TABLET(10 MG) BY MOUTH AT BEDTIME, Disp: 30 tablet, Rfl: 0   Rimegepant Sulfate (NURTEC) 75 MG TBDP, Take 75 mg by mouth as needed (Migraine)., Disp: 8 tablet, Rfl: 11   traZODone (DESYREL) 50 MG tablet, Take 0.5-1 tablets (25-50 mg total) by mouth at bedtime as needed for sleep., Disp: 30 tablet, Rfl: 3   Vitamin D, Ergocalciferol, (DRISDOL) 1.25 MG (50000 UNIT) CAPS capsule, TAKE 1 CAPSULE BY MOUTH EVERY 7 DAYS, Disp: 12 capsule, Rfl: 1  Observations/Objective: Patient is well-developed, well-nourished in no acute distress.  Resting comfortably at home.  Head is normocephalic, atraumatic.  No labored breathing.  Speech is clear and coherent with logical content.  Patient is alert and oriented at baseline.    Assessment and Plan: 1. Acute bacterial bronchitis - predniSONE (DELTASONE) 20 MG tablet; Take 2 tablets (40 mg total) by mouth daily with breakfast.  Dispense: 10 tablet; Refill: 0 - azithromycin (ZITHROMAX) 250 MG tablet; Take 2 tablets on day 1, then 1 tablet daily on  days 2 through 5  Dispense: 6 tablet; Refill: 0 - promethazine-dextromethorphan (PROMETHAZINE-DM) 6.25-15 MG/5ML syrup; Take 5 mLs by mouth 4 (four) times daily as needed.  Dispense: 118 mL; Refill: 0 - benzonatate (TESSALON) 100 MG capsule; Take 1 capsule (100 mg total) by mouth 3 (three) times daily as needed.  Dispense: 30 capsule; Refill: 0  2. Post-COVID syndrome - montelukast (SINGULAIR) 10 MG tablet; TAKE 1 TABLET(10 MG) BY MOUTH AT BEDTIME  Dispense: 30 tablet; Refill: 0 - albuterol (VENTOLIN HFA) 108 (90 Base) MCG/ACT inhaler; INHALE 1 TO 2 PUFFS INTO THE LUNGS EVERY 4 HOURS AS NEEDED FOR WHEEZING OR SHORTNESS OF BREATH  Dispense: 18 g; Refill: 0  3. Moderate persistent asthma, unspecified whether complicated - montelukast (SINGULAIR) 10 MG tablet; TAKE 1 TABLET(10 MG) BY MOUTH AT BEDTIME  Dispense: 30 tablet; Refill: 0 - albuterol (VENTOLIN HFA) 108 (90 Base) MCG/ACT inhaler; INHALE 1 TO 2 PUFFS INTO THE LUNGS EVERY 4 HOURS AS NEEDED FOR WHEEZING OR SHORTNESS OF BREATH  Dispense: 18 g; Refill: 0  - Worsening symptoms that have not responded to OTC medications.  -  Will give Azithromycin, Prednisone, Albuterol (refill), Singulair (refill), Promethazine DM, and Tessalon perles - Continue allergy medications.  - Steam and humidifier can help - Stay well hydrated and get plenty of rest.  - Seek in person evaluation if no symptom improvement or if symptoms worsen   Follow Up Instructions: I discussed the assessment and treatment plan with the patient. The patient was provided an opportunity to ask questions and all were answered. The patient agreed with the plan and demonstrated an understanding of the instructions.  A copy of instructions were sent to the patient via MyChart unless otherwise noted below.   The patient was advised to call back or seek an in-person evaluation if the symptoms worsen or if the condition fails to improve as anticipated.  Time:  I spent 15 minutes with the  patient via telehealth technology discussing the above problems/concerns.    Anne Loveless, PA-C

## 2021-10-29 NOTE — Patient Instructions (Signed)
Anne Shaffer, thank you for joining Anne Loveless, PA-C for today's virtual visit.  While this provider is not your primary care provider (PCP), if your PCP is located in our provider database this encounter information will be shared with them immediately following your visit.  Consent: (Patient) Anne Shaffer provided verbal consent for this virtual visit at the beginning of the encounter.  Current Medications:  Current Outpatient Medications:    azithromycin (ZITHROMAX) 250 MG tablet, Take 2 tablets on day 1, then 1 tablet daily on days 2 through 5, Disp: 6 tablet, Rfl: 0   benzonatate (TESSALON) 100 MG capsule, Take 1 capsule (100 mg total) by mouth 3 (three) times daily as needed., Disp: 30 capsule, Rfl: 0   predniSONE (DELTASONE) 20 MG tablet, Take 2 tablets (40 mg total) by mouth daily with breakfast., Disp: 10 tablet, Rfl: 0   promethazine-dextromethorphan (PROMETHAZINE-DM) 6.25-15 MG/5ML syrup, Take 5 mLs by mouth 4 (four) times daily as needed., Disp: 118 mL, Rfl: 0   albuterol (VENTOLIN HFA) 108 (90 Base) MCG/ACT inhaler, INHALE 1 TO 2 PUFFS INTO THE LUNGS EVERY 4 HOURS AS NEEDED FOR WHEEZING OR SHORTNESS OF BREATH, Disp: 18 g, Rfl: 0   amLODipine (NORVASC) 5 MG tablet, TAKE 1 TABLET(5 MG) BY MOUTH DAILY, Disp: 90 tablet, Rfl: 0   hydrochlorothiazide (HYDRODIURIL) 25 MG tablet, TAKE 1 TABLET BY MOUTH EVERY DAY, Disp: 90 tablet, Rfl: 0   montelukast (SINGULAIR) 10 MG tablet, TAKE 1 TABLET(10 MG) BY MOUTH AT BEDTIME, Disp: 30 tablet, Rfl: 0   Rimegepant Sulfate (NURTEC) 75 MG TBDP, Take 75 mg by mouth as needed (Migraine)., Disp: 8 tablet, Rfl: 11   traZODone (DESYREL) 50 MG tablet, Take 0.5-1 tablets (25-50 mg total) by mouth at bedtime as needed for sleep., Disp: 30 tablet, Rfl: 3   Vitamin D, Ergocalciferol, (DRISDOL) 1.25 MG (50000 UNIT) CAPS capsule, TAKE 1 CAPSULE BY MOUTH EVERY 7 DAYS, Disp: 12 capsule, Rfl: 1   Medications ordered in this encounter:  Meds ordered this  encounter  Medications   predniSONE (DELTASONE) 20 MG tablet    Sig: Take 2 tablets (40 mg total) by mouth daily with breakfast.    Dispense:  10 tablet    Refill:  0    Order Specific Question:   Supervising Provider    Answer:   Merrilee Jansky [[id number]]   azithromycin (ZITHROMAX) 250 MG tablet    Sig: Take 2 tablets on day 1, then 1 tablet daily on days 2 through 5    Dispense:  6 tablet    Refill:  0    Order Specific Question:   Supervising Provider    Answer:   Merrilee Jansky [[id number]]   promethazine-dextromethorphan (PROMETHAZINE-DM) 6.25-15 MG/5ML syrup    Sig: Take 5 mLs by mouth 4 (four) times daily as needed.    Dispense:  118 mL    Refill:  0    Order Specific Question:   Supervising Provider    Answer:   Merrilee Jansky [[id number]]   benzonatate (TESSALON) 100 MG capsule    Sig: Take 1 capsule (100 mg total) by mouth 3 (three) times daily as needed.    Dispense:  30 capsule    Refill:  0    Order Specific Question:   Supervising Provider    Answer:   LAMPTEY, PHILIP O [[id number]]   montelukast (SINGULAIR) 10 MG tablet    Sig: TAKE 1 TABLET(10 MG) BY MOUTH AT BEDTIME  Dispense:  30 tablet    Refill:  0    Order Specific Question:   Supervising Provider    Answer:   Loreli Dollar   albuterol (VENTOLIN HFA) 108 (90 Base) MCG/ACT inhaler    Sig: INHALE 1 TO 2 PUFFS INTO THE LUNGS EVERY 4 HOURS AS NEEDED FOR WHEEZING OR SHORTNESS OF BREATH    Dispense:  18 g    Refill:  0    Order Specific Question:   Supervising Provider    Answer:   Merrilee Jansky [id number]     *If you need refills on other medications prior to your next appointment, please contact your pharmacy*  Follow-Up: Call back or seek an in-person evaluation if the symptoms worsen or if the condition fails to improve as anticipated.  [facility] [phone]  Other Instructions Acute Bronchitis, Adult  Acute bronchitis is sudden inflammation of the main airways  (bronchi) that come off the windpipe (trachea) in the lungs. The swelling causes the airways to get smaller and make more mucus than normal. This can make it hard to breathe and can cause coughing or noisy breathing (wheezing). Acute bronchitis may last several weeks. The cough may last longer. Allergies, asthma, and exposure to smoke may make the condition worse. What are the causes? This condition can be caused by germs and by substances that irritate the lungs, including: Cold and flu viruses. The most common cause of this condition is the virus that causes the common cold. Bacteria. This is less common. Breathing in substances that irritate the lungs, including: Smoke from cigarettes and other forms of tobacco. Dust and pollen. Fumes from household cleaning products, gases, or burned fuel. Indoor or outdoor air pollution. What increases the risk? The following factors may make you more likely to develop this condition: A weak body's defense system, also called the immune system. A condition that affects your lungs and breathing, such as asthma. What are the signs or symptoms? Common symptoms of this condition include: Coughing. This may bring up clear, yellow, or green mucus from your lungs (sputum). Wheezing. Runny or stuffy nose. Having too much mucus in your lungs (chest congestion). Shortness of breath. Aches and pains, including sore throat or chest. How is this diagnosed? This condition is usually diagnosed based on: Your symptoms and medical history. A physical exam. You may also have other tests, including tests to rule out other conditions, such as pneumonia. These tests include: A test of lung function. Test of a mucus sample to look for the presence of bacteria. Tests to check the oxygen level in your blood. Blood tests. Chest X-ray. How is this treated? Most cases of acute bronchitis clear up over time without treatment. Your health care provider may  recommend: Drinking more fluids to help thin your mucus so it is easier to cough up. Taking inhaled medicine (inhaler) to improve air flow in and out of your lungs. Using a vaporizer or a humidifier. These are machines that add water to the air to help you breathe better. Taking a medicine that thins mucus and clears congestion (expectorant). Taking a medicine that prevents or stops coughing (cough suppressant). It is not common to take an antibiotic medicine for this condition. Follow these instructions at home:  Take over-the-counter and prescription medicines only as told by your health care provider. Use an inhaler, vaporizer, or humidifier as told by your health care provider. Take two teaspoons (10 mL) of honey at bedtime to  lessen coughing at night. Drink enough fluid to keep your urine pale yellow. Do not use any products that contain nicotine or tobacco. These products include cigarettes, chewing tobacco, and vaping devices, such as e-cigarettes. If you need help quitting, ask your health care provider. Get plenty of rest. Return to your normal activities as told by your health care provider. Ask your health care provider what activities are safe for you. Keep all follow-up visits. This is important. How is this prevented? To lower your risk of getting this condition again: Wash your hands often with soap and water for at least 20 seconds. If soap and water are not available, use hand sanitizer. Avoid contact with people who have cold symptoms. Try not to touch your mouth, nose, or eyes with your hands. Avoid breathing in smoke or chemical fumes. Breathing smoke or chemical fumes will make your condition worse. Get the flu shot every year. Contact a health care provider if: Your symptoms do not improve after 2 weeks. You have trouble coughing up the mucus. Your cough keeps you awake at night. You have a fever. Get help right away if you: Cough up blood. Feel pain in your  chest. Have severe shortness of breath. Faint or keep feeling like you are going to faint. Have a severe headache. Have a fever or chills that get worse. These symptoms may represent a serious problem that is an emergency. Do not wait to see if the symptoms will go away. Get medical help right away. Call your local emergency services (911 in the [place] Do not drive yourself to the hospital. Summary Acute bronchitis is inflammation of the main airways (bronchi) that come off the windpipe (trachea) in the lungs. The swelling causes the airways to get smaller and make more mucus than normal. Drinking more fluids can help thin your mucus so it is easier to cough up. Take over-the-counter and prescription medicines only as told by your health care provider. Do not use any products that contain nicotine or tobacco. These products include cigarettes, chewing tobacco, and vaping devices, such as e-cigarettes. If you need help quitting, ask your health care provider. Contact a health care provider if your symptoms do not improve after 2 weeks. This information is not intended to replace advice given to you by your health care provider. Make sure you discuss any questions you have with your health care provider. Document Revised: 05/01/2021 Document Reviewed: 05/22/2020 Elsevier Patient Education  [place].    If you have been instructed to have an in-person evaluation today at a local Urgent Care facility, please use the link below. It will take you to a list of all of our available [facility], including address, phone number and hours of operation. Please do not delay care.  [facility]  If you or a family member do not have a primary care provider, use the link below to schedule a visit and establish care. When you choose a [facility] primary care physician or advanced practice provider, you gain a long-term partner in health. Find a Primary Care Provider  Learn  more about [facility] in-office and virtual care options: [facility]

## 2022-02-19 ENCOUNTER — Telehealth: Payer: 59 | Admitting: Family Medicine

## 2022-02-19 DIAGNOSIS — J208 Acute bronchitis due to other specified organisms: Secondary | ICD-10-CM

## 2022-02-19 MED ORDER — ALBUTEROL SULFATE HFA 108 (90 BASE) MCG/ACT IN AERS: 2.0000 | INHALATION_SPRAY | Freq: Four times a day (QID) | RESPIRATORY_TRACT | 0 refills | Status: DC | PRN

## 2022-02-19 MED ORDER — BENZONATATE 100 MG PO CAPS: 100.0000 mg | ORAL_CAPSULE | Freq: Three times a day (TID) | ORAL | 0 refills | Status: DC | PRN

## 2022-02-19 MED ORDER — PREDNISONE 20 MG PO TABS: 40.0000 mg | ORAL_TABLET | Freq: Every day | ORAL | 0 refills | Status: AC

## 2022-02-19 MED ORDER — PSEUDOEPH-BROMPHEN-DM 30-2-10 MG/5ML PO SYRP: 5.0000 mL | ORAL_SOLUTION | Freq: Four times a day (QID) | ORAL | 0 refills | Status: DC | PRN

## 2022-02-19 NOTE — Progress Notes (Signed)
Virtual Visit Consent   Anne Shaffer, you are scheduled for a virtual visit with a [facility] provider today. Just as with appointments in the office, your consent must be obtained to participate. Your consent will be active for this visit and any virtual visit you may have with one of our providers in the next 365 days. If you have a MyChart account, a copy of this consent can be sent to you electronically.  As this is a virtual visit, video technology does not allow for your provider to perform a traditional examination. This may limit your provider's ability to fully assess your condition. If your provider identifies any concerns that need to be evaluated in person or the need to arrange testing (such as labs, EKG, etc.), we will make arrangements to do so. Although advances in technology are sophisticated, we cannot ensure that it will always work on either your end or our end. If the connection with a video visit is poor, the visit may have to be switched to a telephone visit. With either a video or telephone visit, we are not always able to ensure that we have a secure connection.  By engaging in this virtual visit, you consent to the provision of healthcare and authorize for your insurance to be billed (if applicable) for the services provided during this visit. Depending on your insurance coverage, you may receive a charge related to this service.  I need to obtain your verbal consent now. Are you willing to proceed with your visit today? Anne Shaffer has provided verbal consent on 02/19/2022 for a virtual visit (video or telephone). Freddy Finner, NP  Date: 02/19/2022 11:39 AM  Virtual Visit via Video Note   I, Freddy Finner, connected with  Anne Shaffer  ([id number], 07-Apr-1985) on 02/19/22 at 11:30 AM EST by a video-enabled telemedicine application and verified that I am speaking with the correct person using two identifiers.  Location: Patient: Virtual Visit Location Patient:  Home Provider: Virtual Visit Location Provider: Home Office   I discussed the limitations of evaluation and management by telemedicine and the availability of in person appointments. The patient expressed understanding and agreed to proceed.    History of Present Illness: Anne Shaffer is a 37 y.o. who identifies as a female who was assigned female at birth, and is being seen today for sinus congestion. Onset was this past Sunday- progressively getting worse.  Last several days has had an increased cough - causing increase in chest tightness (does report asthma). Coughing up green mucus.  Coughing around the clock and worse at night. Denies chest pain, shortness of breath, sore throat, ear pain, fevers and chills Reports using inhaler needs refill.    Problems:  Patient Active Problem List   Diagnosis Date Noted   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   Migraine without aura and without status migrainosus, not intractable 11/01/2020   Encounter to establish care 05/31/2020   Moderate persistent asthma 05/31/2020   Post-COVID syndrome 05/31/2020   Essential hypertension    Anxiety 03/18/2018   Class 3 severe obesity due to excess calories without serious comorbidity with body mass index (BMI) of 50.0 to 59.9 in adult [facility]) 03/18/2018   THYROID NODULE 12/03/2008    Allergies:  Allergies  Allergen Reactions   Penicillins Itching   Shellfish Allergy    Tomato Anxiety, Hives, Itching and Swelling   Shellfish Allergy    Medications:  Current Outpatient Medications:  albuterol (VENTOLIN HFA) 108 (90 Base) MCG/ACT inhaler, INHALE 1 TO 2 PUFFS INTO THE LUNGS EVERY 4 HOURS AS NEEDED FOR WHEEZING OR SHORTNESS OF BREATH, Disp: 18 g, Rfl: 0   amLODipine (NORVASC) 5 MG tablet, TAKE 1 TABLET(5 MG) BY MOUTH DAILY, Disp: 90 tablet, Rfl: 0   benzonatate (TESSALON) 100 MG capsule, Take 1 capsule (100 mg total) by mouth 3 (three) times daily as needed., Disp: 30 capsule, Rfl: 0    hydrochlorothiazide (HYDRODIURIL) 25 MG tablet, TAKE 1 TABLET BY MOUTH EVERY DAY, Disp: 90 tablet, Rfl: 0   montelukast (SINGULAIR) 10 MG tablet, TAKE 1 TABLET(10 MG) BY MOUTH AT BEDTIME, Disp: 30 tablet, Rfl: 0   predniSONE (DELTASONE) 20 MG tablet, Take 2 tablets (40 mg total) by mouth daily with breakfast., Disp: 10 tablet, Rfl: 0   promethazine-dextromethorphan (PROMETHAZINE-DM) 6.25-15 MG/5ML syrup, Take 5 mLs by mouth 4 (four) times daily as needed., Disp: 118 mL, Rfl: 0   Rimegepant Sulfate (NURTEC) 75 MG TBDP, Take 75 mg by mouth as needed (Migraine)., Disp: 8 tablet, Rfl: 11   traZODone (DESYREL) 50 MG tablet, Take 0.5-1 tablets (25-50 mg total) by mouth at bedtime as needed for sleep., Disp: 30 tablet, Rfl: 3   Vitamin D, Ergocalciferol, (DRISDOL) 1.25 MG (50000 UNIT) CAPS capsule, TAKE 1 CAPSULE BY MOUTH EVERY 7 DAYS, Disp: 12 capsule, Rfl: 1  Observations/Objective: Patient is well-developed, well-nourished in no acute distress.  Resting comfortably  at home.  Head is normocephalic, atraumatic.  No labored breathing.  Speech is clear and coherent with logical content.  Patient is alert and oriented at baseline.  Cough present  Assessment and Plan:  1. Acute viral bronchitis  - albuterol (VENTOLIN HFA) 108 (90 Base) MCG/ACT inhaler; Inhale 2 puffs into the lungs every 6 (six) hours as needed for wheezing or shortness of breath.  Dispense: 8 g; Refill: 0 - brompheniramine-pseudoephedrine-DM 30-2-10 MG/5ML syrup; Take 5 mLs by mouth 4 (four) times daily as needed.  Dispense: 120 mL; Refill: 0 - benzonatate (TESSALON) 100 MG capsule; Take 1 capsule (100 mg total) by mouth 3 (three) times daily as needed.  Dispense: 30 capsule; Refill: 0 - predniSONE (DELTASONE) 20 MG tablet; Take 2 tablets (40 mg total) by mouth daily with breakfast for 3 days.  Dispense: 6 tablet; Refill: 0  - Take meds as prescribed - Rest voice - Use a cool mist humidifier especially during the winter months  when heat dries out the air. - Use saline nose sprays frequently to help soothe nasal passages if they are drying out. - Stay hydrated by drinking plenty of fluids - Keep thermostat turn down low to prevent drying out which can cause a dry cough. - For any cough or congestion- robitussin DM or Delsym as needed - For fever or aches or pains- take tylenol or ibuprofen as directed on bottle             * for fevers greater than 101 orally you may alternate ibuprofen and tylenol every 3 hours.  If you do not improve you will need a follow up visit in person.  Might benefit from a steroid inhaler during viral season.               Reviewed side effects, risks and benefits of medication.    Patient acknowledged agreement and understanding of the plan.   Past Medical, Surgical, Social History, Allergies, and Medications have been Reviewed.   Follow Up Instructions: I discussed the assessment and  treatment plan with the patient. The patient was provided an opportunity to ask questions and all were answered. The patient agreed with the plan and demonstrated an understanding of the instructions.  A copy of instructions were sent to the patient via MyChart unless otherwise noted below.    The patient was advised to call back or seek an in-person evaluation if the symptoms worsen or if the condition fails to improve as anticipated.  Time:  I spent 10 minutes with the patient via telehealth technology discussing the above problems/concerns.    Perlie Mayo, NP

## 2022-02-19 NOTE — Patient Instructions (Signed)
Anne Shaffer, thank you for joining Perlie Mayo, NP for today's virtual visit.  While this provider is not your primary care provider (PCP), if your PCP is located in our provider database this encounter information will be shared with them immediately following your visit.   [facility] account gives you access to today's visit and all your visits, tests, and labs performed at [facility] " click here if you don't have a [facility] account or go to [email or web address]  Consent: (Patient) Anne Shaffer provided verbal consent for this virtual visit at the beginning of the encounter.  Current Medications:  Current Outpatient Medications:    albuterol (VENTOLIN HFA) 108 (90 Base) MCG/ACT inhaler, Inhale 2 puffs into the lungs every 6 (six) hours as needed for wheezing or shortness of breath., Disp: 8 g, Rfl: 0   brompheniramine-pseudoephedrine-DM 30-2-10 MG/5ML syrup, Take 5 mLs by mouth 4 (four) times daily as needed., Disp: 120 mL, Rfl: 0   amLODipine (NORVASC) 5 MG tablet, TAKE 1 TABLET(5 MG) BY MOUTH DAILY, Disp: 90 tablet, Rfl: 0   benzonatate (TESSALON) 100 MG capsule, Take 1 capsule (100 mg total) by mouth 3 (three) times daily as needed., Disp: 30 capsule, Rfl: 0   hydrochlorothiazide (HYDRODIURIL) 25 MG tablet, TAKE 1 TABLET BY MOUTH EVERY DAY, Disp: 90 tablet, Rfl: 0   montelukast (SINGULAIR) 10 MG tablet, TAKE 1 TABLET(10 MG) BY MOUTH AT BEDTIME, Disp: 30 tablet, Rfl: 0   predniSONE (DELTASONE) 20 MG tablet, Take 2 tablets (40 mg total) by mouth daily with breakfast for 3 days., Disp: 6 tablet, Rfl: 0   Rimegepant Sulfate (NURTEC) 75 MG TBDP, Take 75 mg by mouth as needed (Migraine)., Disp: 8 tablet, Rfl: 11   traZODone (DESYREL) 50 MG tablet, Take 0.5-1 tablets (25-50 mg total) by mouth at bedtime as needed for sleep., Disp: 30 tablet, Rfl: 3   Vitamin D, Ergocalciferol, (DRISDOL) 1.25 MG (50000 UNIT) CAPS capsule, TAKE 1 CAPSULE BY MOUTH  EVERY 7 DAYS, Disp: 12 capsule, Rfl: 1   Medications ordered in this encounter:  Meds ordered this encounter  Medications   albuterol (VENTOLIN HFA) 108 (90 Base) MCG/ACT inhaler    Sig: Inhale 2 puffs into the lungs every 6 (six) hours as needed for wheezing or shortness of breath.    Dispense:  8 g    Refill:  0    Order Specific Question:   Supervising Provider    Answer:   Chase Picket [id number]   brompheniramine-pseudoephedrine-DM 30-2-10 MG/5ML syrup    Sig: Take 5 mLs by mouth 4 (four) times daily as needed.    Dispense:  120 mL    Refill:  0    Order Specific Question:   Supervising Provider    Answer:   Chase Picket [[id number]]   benzonatate (TESSALON) 100 MG capsule    Sig: Take 1 capsule (100 mg total) by mouth 3 (three) times daily as needed.    Dispense:  30 capsule    Refill:  0    Order Specific Question:   Supervising Provider    Answer:   Chase Picket [[id number]]   predniSONE (DELTASONE) 20 MG tablet    Sig: Take 2 tablets (40 mg total) by mouth daily with breakfast for 3 days.    Dispense:  6 tablet    Refill:  0    Order Specific Question:   Supervising Provider    Answer:   Chase Picket [  [id number]]     *If you need refills on other medications prior to your next appointment, please contact your pharmacy*  Follow-Up: Call back or seek an in-person evaluation if the symptoms worsen or if the condition fails to improve as anticipated.  [place] [phone]   Other Instructions  - Take meds as prescribed - Rest voice - Use a cool mist humidifier especially during the winter months when heat dries out the air. - Use saline nose sprays frequently to help soothe nasal passages if they are drying out. - Stay hydrated by drinking plenty of fluids - Keep thermostat turn down low to prevent drying out which can cause a dry cough. - For any cough or congestion- robitussin DM or Delsym as needed - For fever or aches or pains-  take tylenol or ibuprofen as directed on bottle             * for fevers greater than 101 orally you may alternate ibuprofen and tylenol every 3 hours.  If you do not improve you will need a follow up visit in person.                  If you have been instructed to have an in-person evaluation today at a local Urgent Care facility, please use the link below. It will take you to a list of all of our available [facility], including address, phone number and hours of operation. Please do not delay care.  [facility]  If you or a family member do not have a primary care provider, use the link below to schedule a visit and establish care. When you choose a [facility] primary care physician or advanced practice provider, you gain a long-term partner in health. Find a Primary Care Provider  Learn more about [facility] in-office and virtual care options: [facility]

## 2022-03-02 ENCOUNTER — Telehealth: Payer: Self-pay | Admitting: Nurse Practitioner

## 2022-03-02 NOTE — Telephone Encounter (Signed)
[facility] sent refill request for amlodipine please send to the  [facility], [address] AT [address] [place]

## 2022-03-08 ENCOUNTER — Other Ambulatory Visit (HOSPITAL_BASED_OUTPATIENT_CLINIC_OR_DEPARTMENT_OTHER): Payer: Self-pay | Admitting: Nurse Practitioner

## 2022-03-08 DIAGNOSIS — I10 Essential (primary) hypertension: Secondary | ICD-10-CM

## 2022-03-13 ENCOUNTER — Other Ambulatory Visit (HOSPITAL_BASED_OUTPATIENT_CLINIC_OR_DEPARTMENT_OTHER): Payer: Self-pay | Admitting: Nurse Practitioner

## 2022-03-13 DIAGNOSIS — I10 Essential (primary) hypertension: Secondary | ICD-10-CM

## 2022-03-27 ENCOUNTER — Telehealth: Payer: 59 | Admitting: Physician Assistant

## 2022-03-27 DIAGNOSIS — J208 Acute bronchitis due to other specified organisms: Secondary | ICD-10-CM | POA: Diagnosis not present

## 2022-03-27 DIAGNOSIS — J4541 Moderate persistent asthma with (acute) exacerbation: Secondary | ICD-10-CM | POA: Diagnosis not present

## 2022-03-27 DIAGNOSIS — J069 Acute upper respiratory infection, unspecified: Secondary | ICD-10-CM

## 2022-03-27 MED ORDER — BENZONATATE 100 MG PO CAPS: 100.0000 mg | ORAL_CAPSULE | Freq: Three times a day (TID) | ORAL | 0 refills | Status: DC | PRN

## 2022-03-27 MED ORDER — PREDNISONE 20 MG PO TABS: 40.0000 mg | ORAL_TABLET | Freq: Every day | ORAL | 0 refills | Status: DC

## 2022-03-27 MED ORDER — FLUTICASONE PROPIONATE 50 MCG/ACT NA SUSP: 2.0000 | Freq: Every day | NASAL | 0 refills | Status: DC

## 2022-03-27 NOTE — Progress Notes (Signed)
I have spent 5 minutes in review of e-visit questionnaire, review and updating patient chart, medical decision making and response to patient.   Devaney Segers Cody Estanislao Harmon, PA-C    

## 2022-03-27 NOTE — Progress Notes (Signed)
E-Visit for Upper Respiratory Infection   We are sorry you are not feeling well.  Here is how we plan to help!  Based on what you have shared with me, it looks like you may have a viral upper respiratory infection.  Upper respiratory infections are caused by a large number of viruses; however, rhinovirus is the most common cause.   Symptoms vary from person to person, with common symptoms including sore throat, cough, fatigue or lack of energy and feeling of general discomfort.  A low-grade fever of up to 100.4 may present, but is often uncommon.  Symptoms vary however, and are closely related to a person's age or underlying illnesses.  The most common symptoms associated with an upper respiratory infection are nasal discharge or congestion, cough, sneezing, headache and pressure in the ears and face.  These symptoms usually persist for about 3 to 10 days, but can last up to 2 weeks.  It is important to know that upper respiratory infections do not cause serious illness or complications in most cases.    Upper respiratory infections can be transmitted from person to person, with the most common method of transmission being a person's hands.  The virus is able to live on the skin and can infect other persons for up to 2 hours after direct contact.  Also, these can be transmitted when someone coughs or sneezes; thus, it is important to cover the mouth to reduce this risk.  To keep the spread of the illness at [place], good hand hygiene is very important.  This is an infection that is most likely caused by a virus. There are no specific treatments other than to help you with the symptoms until the infection runs its course.  We are sorry you are not feeling well.  Here is how we plan to help!   For nasal congestion, you may use an oral decongestants such as Mucinex D or if you have glaucoma or high blood pressure use plain Mucinex.  Saline nasal spray or nasal drops can help and can safely be used as often as  needed for congestion.  For your congestion, I have prescribed Fluticasone nasal spray one spray in each nostril twice a day  If you do not have a history of heart disease, hypertension, diabetes or thyroid disease, prostate/bladder issues or glaucoma, you may also use Sudafed to treat nasal congestion.  It is highly recommended that you consult with a pharmacist or your primary care physician to ensure this medication is safe for you to take.     If you have a cough, you may use cough suppressants such as Delsym and Robitussin.  If you have glaucoma or high blood pressure, you can also use Coricidin HBP.   For cough I have prescribed for you A prescription cough medication called Tessalon Perles 100 mg. You may take 1-2 capsules every 8 hours as needed for cough  Giving your asthma and exacerbation of this due to current illness, I have prescribed a short course of prednisone to take as directed.  If you have a sore or scratchy throat, use a saltwater gargle-  to  teaspoon of salt dissolved in a 4-ounce to 8-ounce glass of warm water.  Gargle the solution for approximately 15-30 seconds and then spit.  It is important not to swallow the solution.  You can also use throat lozenges/cough drops and Chloraseptic spray to help with throat pain or discomfort.  Warm or cold liquids can also be helpful  in relieving throat pain.  For headache, pain or general discomfort, you can use Ibuprofen or Tylenol as directed.   Some authorities believe that zinc sprays or the use of Echinacea may shorten the course of your symptoms.   HOME CARE Only take medications as instructed by your medical team. Be sure to drink plenty of fluids. Water is fine as well as fruit juices, sodas and electrolyte beverages. You may want to stay away from caffeine or alcohol. If you are nauseated, try taking small sips of liquids. How do you know if you are getting enough fluid? Your urine should be a pale yellow or almost  colorless. Get rest. Taking a steamy shower or using a humidifier may help nasal congestion and ease sore throat pain. You can place a towel over your head and breathe in the steam from hot water coming from a faucet. Using a saline nasal spray works much the same way. Cough drops, hard candies and sore throat lozenges may ease your cough. Avoid close contacts especially the very young and the elderly Cover your mouth if you cough or sneeze Always remember to wash your hands.   GET HELP RIGHT AWAY IF: You develop worsening fever. If your symptoms do not improve within 10 days You develop yellow or green discharge from your nose over 3 days. You have coughing fits You develop a severe head ache or visual changes. You develop shortness of breath, difficulty breathing or start having chest pain Your symptoms persist after you have completed your treatment plan  MAKE SURE YOU  Understand these instructions. Will watch your condition. Will get help right away if you are not doing well or get worse.  Thank you for choosing an e-visit.  Your e-visit answers were reviewed by a board certified advanced clinical practitioner to complete your personal care plan. Depending upon the condition, your plan could have included both over the counter or prescription medications.  Please review your pharmacy choice. Make sure the pharmacy is open so you can pick up prescription now. If there is a problem, you may contact your provider through [employer] and have the prescription routed to another pharmacy.  Your safety is important to [place]. If you have drug allergies check your prescription carefully.   For the next 24 hours you can use MyChart to ask questions about today's visit, request a non-urgent call back, or ask for a work or school excuse. You will get an email in the next two days asking about your experience. I hope that your e-visit has been valuable and will speed your recovery.

## 2022-04-07 MED ORDER — ALBUTEROL SULFATE (2.5 MG/3ML) 0.083% IN NEBU: 2.5000 mg | INHALATION_SOLUTION | Freq: Four times a day (QID) | RESPIRATORY_TRACT | 1 refills | Status: DC | PRN

## 2022-04-07 NOTE — Addendum Note (Signed)
Addended by: Brunetta Jeans on: 04/07/2022 10:10 AM   Modules accepted: Orders

## 2022-05-21 ENCOUNTER — Telehealth: Payer: 59 | Admitting: Physician Assistant

## 2022-05-21 ENCOUNTER — Telehealth: Payer: 59

## 2022-05-21 DIAGNOSIS — J301 Allergic rhinitis due to pollen: Secondary | ICD-10-CM

## 2022-05-21 DIAGNOSIS — H1013 Acute atopic conjunctivitis, bilateral: Secondary | ICD-10-CM

## 2022-05-21 MED ORDER — OLOPATADINE HCL 0.1 % OP SOLN: 1.0000 [drp] | Freq: Two times a day (BID) | OPHTHALMIC | 0 refills | Status: DC

## 2022-05-21 MED ORDER — OLOPATADINE HCL 0.1 % OP SOLN
1.0000 [drp] | Freq: Two times a day (BID) | OPHTHALMIC | 0 refills | Status: DC
Start: 2022-05-21 — End: 2023-04-09

## 2022-05-21 MED ORDER — FLUTICASONE PROPIONATE 50 MCG/ACT NA SUSP: 2.0000 | Freq: Every day | NASAL | 0 refills | Status: DC

## 2022-05-21 MED ORDER — FEXOFENADINE HCL 180 MG PO TABS: 180.0000 mg | ORAL_TABLET | Freq: Every day | ORAL | 0 refills | Status: DC

## 2022-05-21 MED ORDER — FLUTICASONE PROPIONATE 50 MCG/ACT NA SUSP
2.0000 | Freq: Every day | NASAL | 0 refills | Status: AC
Start: 2022-05-21 — End: ?

## 2022-05-21 NOTE — Addendum Note (Signed)
Addended by: Freddy Finner on: 05/21/2022 12:15 PM   Modules accepted: Orders

## 2022-05-21 NOTE — Progress Notes (Signed)
E visit for Allergic Rhinitis We are sorry that you are not feeling well.  Here is how we plan to help!  Based on what you have shared with me it looks like you have Allergic Rhinitis.  Rhinitis is when a reaction occurs that causes nasal congestion, runny nose, sneezing, and itching.  Most types of rhinitis are caused by an inflammation and are associated with symptoms in the eyes ears or throat. There are several types of rhinitis.  The most common are acute rhinitis, which is usually caused by a viral illness, allergic or seasonal rhinitis, and nonallergic or year-round rhinitis.  Nasal allergies occur certain times of the year.  Allergic rhinitis is caused when allergens in the air trigger the release of histamine in the body.  Histamine causes itching, swelling, and fluid to build up in the fragile linings of the nasal passages, sinuses and eyelids.  An itchy nose and clear discharge are common.  I recommend the following over the counter treatments: You should take a daily dose of antihistamine I have prescribed Fexofenadine  Take 1 tablet daily  I also would recommend a nasal spray: Flonase 2 sprays into each nostril once daily and Saline 1 spray into each nostril as needed; Floanse prescribed today  You may also benefit from eye drops such as: Olopatadine eye drops Use 1-2 eye drops in each eye every twelve hours; This was prescribed as well.   HOME CARE:  You can use an over-the-counter saline nasal spray as needed Avoid areas where there is heavy dust, mites, or molds Stay indoors on windy days during the pollen season Keep windows closed in home, at least in bedroom; use air conditioner. Use high-efficiency house air filter Keep windows closed in car, turn AC on re-circulate Avoid playing out with dog during pollen season  GET HELP RIGHT AWAY IF:  If your symptoms do not improve within 10 days You become short of breath You develop yellow or green discharge from your nose  for over 3 days You have coughing fits  MAKE SURE YOU:  Understand these instructions Will watch your condition Will get help right away if you are not doing well or get worse  Thank you for choosing an e-visit. Your e-visit answers were reviewed by a board certified advanced clinical practitioner to complete your personal care plan. Depending upon the condition, your plan could have included both over the counter or prescription medications. Please review your pharmacy choice. Be sure that the pharmacy you have chosen is open so that you can pick up your prescription now.  If there is a problem you may message your provider in MyChart to have the prescription routed to another pharmacy. Your safety is important to [place]. If you have drug allergies check your prescription carefully.  For the next 24 hours, you can use MyChart to ask questions about today's visit, request a non-urgent call back, or ask for a work or school excuse from your e-visit provider. You will get an email in the next two days asking about your experience. I hope that your e-visit has been valuable and will speed your recovery.   I have spent 5 minutes in review of e-visit questionnaire, review and updating patient chart, medical decision making and response to patient.   [name], PA-C

## 2022-07-02 ENCOUNTER — Telehealth: Payer: 59 | Admitting: Physician Assistant

## 2022-07-02 DIAGNOSIS — J208 Acute bronchitis due to other specified organisms: Secondary | ICD-10-CM | POA: Diagnosis not present

## 2022-07-02 MED ORDER — PREDNISONE 20 MG PO TABS
40.0000 mg | ORAL_TABLET | Freq: Every day | ORAL | 0 refills | Status: DC
Start: 2022-07-02 — End: 2022-09-17

## 2022-07-02 MED ORDER — BENZONATATE 100 MG PO CAPS
100.0000 mg | ORAL_CAPSULE | Freq: Three times a day (TID) | ORAL | 0 refills | Status: DC | PRN
Start: 2022-07-02 — End: 2022-08-12

## 2022-07-02 NOTE — Progress Notes (Signed)
I have spent 5 minutes in review of e-visit questionnaire, review and updating patient chart, medical decision making and response to patient.   Donyell Ding Cody Cliff Damiani, PA-C    

## 2022-07-02 NOTE — Progress Notes (Signed)
E-Visit for Cough  We are sorry that you are not feeling well.  Here is how we plan to help!  Based on your presentation I believe you most likely have A cough due to a virus.  This is called viral bronchitis and is best treated by rest, plenty of fluids and control of the cough.  You may use Ibuprofen or Tylenol as directed to help your symptoms.     In addition I have sent in  A prescription cough medication called Tessalon Perles 100mg . You may take 1-2 capsules every 8 hours as needed for your cough. I have also sent in a short course of prednisone to take as directed.  From your responses in the eVisit questionnaire you describe inflammation in the upper respiratory tract which is causing a significant cough.  This is commonly called Bronchitis and has four common causes:   Allergies Viral Infections Acid Reflux Bacterial Infection Allergies, viruses and acid reflux are treated by controlling symptoms or eliminating the cause. An example might be a cough caused by taking certain blood pressure medications. You stop the cough by changing the medication. Another example might be a cough caused by acid reflux. Controlling the reflux helps control the cough.  USE OF BRONCHODILATOR ("RESCUE") INHALERS: There is a risk from using your bronchodilator too frequently.  The risk is that over-reliance on a medication which only relaxes the muscles surrounding the breathing tubes can reduce the effectiveness of medications prescribed to reduce swelling and congestion of the tubes themselves.  Although you feel brief relief from the bronchodilator inhaler, your asthma may actually be worsening with the tubes becoming more swollen and filled with mucus.  This can delay other crucial treatments, such as oral steroid medications. If you need to use a bronchodilator inhaler daily, several times per day, you should discuss this with your provider.  There are probably better treatments that could be used to keep  your asthma under control.     HOME CARE Only take medications as instructed by your medical team. Complete the entire course of an antibiotic. Drink plenty of fluids and get plenty of rest. Avoid close contacts especially the very young and the elderly Cover your mouth if you cough or cough into your sleeve. Always remember to wash your hands A steam or ultrasonic humidifier can help congestion.   GET HELP RIGHT AWAY IF: You develop worsening fever. You become short of breath You cough up blood. Your symptoms persist after you have completed your treatment plan MAKE SURE YOU  Understand these instructions. Will watch your condition. Will get help right away if you are not doing well or get worse.    Thank you for choosing an e-visit.  Your e-visit answers were reviewed by a board certified advanced clinical practitioner to complete your personal care plan. Depending upon the condition, your plan could have included both over the counter or prescription medications.  Please review your pharmacy choice. Make sure the pharmacy is open so you can pick up prescription now. If there is a problem, you may contact your provider through [employer] and have the prescription routed to another pharmacy.  Your safety is important to [place]. If you have drug allergies check your prescription carefully.   For the next 24 hours you can use MyChart to ask questions about today's visit, request a non-urgent call back, or ask for a work or school excuse. You will get an email in the next two days asking about your  experience. I hope that your e-visit has been valuable and will speed your recovery.

## 2022-08-12 ENCOUNTER — Telehealth: Payer: 59 | Admitting: Nurse Practitioner

## 2022-08-12 DIAGNOSIS — J208 Acute bronchitis due to other specified organisms: Secondary | ICD-10-CM

## 2022-08-12 MED ORDER — ALBUTEROL SULFATE (2.5 MG/3ML) 0.083% IN NEBU: 2.5000 mg | INHALATION_SOLUTION | Freq: Four times a day (QID) | RESPIRATORY_TRACT | 1 refills | Status: DC | PRN

## 2022-08-12 MED ORDER — BENZONATATE 100 MG PO CAPS: 100.0000 mg | ORAL_CAPSULE | Freq: Three times a day (TID) | ORAL | 0 refills | Status: DC | PRN

## 2022-08-12 NOTE — Addendum Note (Signed)
Addended by: Bennie Pierini on: 08/12/2022 09:39 AM   Modules accepted: Orders

## 2022-08-12 NOTE — Addendum Note (Signed)
Addended by: Bennie Pierini on: 08/12/2022 01:11 PM   Modules accepted: Level of Service

## 2022-08-12 NOTE — Progress Notes (Signed)
E-Visit for Cough  We are sorry that you are not feeling well.  Here is how we plan to help!  Based on your presentation I believe you most likely have A cough due to a virus.  This is called viral bronchitis and is best treated by rest, plenty of fluids and control of the cough.  You may use Ibuprofen or Tylenol as directed to help your symptoms.     In addition you may use A prescription cough medication called Tessalon Perles 100mg . You may take 1-2 capsules every 8 hours as needed for your cough.  This is a frequent issue with you. You may need to see your PCP for further work up to see why you continue to get this.  From your responses in the eVisit questionnaire you describe inflammation in the upper respiratory tract which is causing a significant cough.  This is commonly called Bronchitis and has four common causes:   Allergies Viral Infections Acid Reflux Bacterial Infection Allergies, viruses and acid reflux are treated by controlling symptoms or eliminating the cause. An example might be a cough caused by taking certain blood pressure medications. You stop the cough by changing the medication. Another example might be a cough caused by acid reflux. Controlling the reflux helps control the cough.  USE OF BRONCHODILATOR ("RESCUE") INHALERS: There is a risk from using your bronchodilator too frequently.  The risk is that over-reliance on a medication which only relaxes the muscles surrounding the breathing tubes can reduce the effectiveness of medications prescribed to reduce swelling and congestion of the tubes themselves.  Although you feel brief relief from the bronchodilator inhaler, your asthma may actually be worsening with the tubes becoming more swollen and filled with mucus.  This can delay other crucial treatments, such as oral steroid medications. If you need to use a bronchodilator inhaler daily, several times per day, you should discuss this with your provider.  There are  probably better treatments that could be used to keep your asthma under control.     HOME CARE Only take medications as instructed by your medical team. Complete the entire course of an antibiotic. Drink plenty of fluids and get plenty of rest. Avoid close contacts especially the very young and the elderly Cover your mouth if you cough or cough into your sleeve. Always remember to wash your hands A steam or ultrasonic humidifier can help congestion.   GET HELP RIGHT AWAY IF: You develop worsening fever. You become short of breath You cough up blood. Your symptoms persist after you have completed your treatment plan MAKE SURE YOU  Understand these instructions. Will watch your condition. Will get help right away if you are not doing well or get worse.    Thank you for choosing an e-visit.  Your e-visit answers were reviewed by a board certified advanced clinical practitioner to complete your personal care plan. Depending upon the condition, your plan could have included both over the counter or prescription medications.  Please review your pharmacy choice. Make sure the pharmacy is open so you can pick up prescription now. If there is a problem, you may contact your provider through [employer] and have the prescription routed to another pharmacy.  Your safety is important to [place]. If you have drug allergies check your prescription carefully.   For the next 24 hours you can use MyChart to ask questions about today's visit, request a non-urgent call back, or ask for a work or school excuse. You will  get an email in the next two days asking about your experience. I hope that your e-visit has been valuable and will speed your recovery.  Anne Daphine Deutscher, FNP   5-10 minutes spent reviewing and documenting in chart.

## 2022-09-17 ENCOUNTER — Telehealth: Payer: Self-pay | Admitting: Nurse Practitioner

## 2022-09-17 DIAGNOSIS — J208 Acute bronchitis due to other specified organisms: Secondary | ICD-10-CM

## 2022-09-17 MED ORDER — AZITHROMYCIN 250 MG PO TABS
ORAL_TABLET | ORAL | 0 refills | Status: AC
Start: 2022-09-17 — End: 2022-09-22

## 2022-09-17 MED ORDER — ALBUTEROL SULFATE HFA 108 (90 BASE) MCG/ACT IN AERS
2.0000 | INHALATION_SPRAY | Freq: Four times a day (QID) | RESPIRATORY_TRACT | 0 refills | Status: DC | PRN
Start: 2022-09-17 — End: 2023-04-12

## 2022-09-17 MED ORDER — PREDNISONE 20 MG PO TABS
20.0000 mg | ORAL_TABLET | Freq: Two times a day (BID) | ORAL | 0 refills | Status: AC
Start: 2022-09-17 — End: 2022-09-22

## 2022-09-17 NOTE — Progress Notes (Signed)
Virtual Visit Consent   Anne Shaffer, you are scheduled for a virtual visit with a [facility] provider today. Just as with appointments in the office, your consent must be obtained to participate. Your consent will be active for this visit and any virtual visit you may have with one of our providers in the next 365 days. If you have a MyChart account, a copy of this consent can be sent to you electronically.  As this is a virtual visit, video technology does not allow for your provider to perform a traditional examination. This may limit your provider's ability to fully assess your condition. If your provider identifies any concerns that need to be evaluated in person or the need to arrange testing (such as labs, EKG, etc.), we will make arrangements to do so. Although advances in technology are sophisticated, we cannot ensure that it will always work on either your end or our end. If the connection with a video visit is poor, the visit may have to be switched to a telephone visit. With either a video or telephone visit, we are not always able to ensure that we have a secure connection.  By engaging in this virtual visit, you consent to the provision of healthcare and authorize for your insurance to be billed (if applicable) for the services provided during this visit. Depending on your insurance coverage, you may receive a charge related to this service.  I need to obtain your verbal consent now. Are you willing to proceed with your visit today? Anne Shaffer has provided verbal consent on 09/17/2022 for a virtual visit (video or telephone). Viviano Simas, FNP  Date: 09/17/2022 12:18 PM  Virtual Visit via Video Note   I, Viviano Simas, connected with  Anne Shaffer  ([id number], 1985/07/28) on 09/17/22 at 12:30 PM EDT by a video-enabled telemedicine application and verified that I am speaking with the correct person using two identifiers.  Location: Patient: Virtual Visit Location Patient:  Home Provider: Virtual Visit Location Provider: Home Office   I discussed the limitations of evaluation and management by telemedicine and the availability of in person appointments. The patient expressed understanding and agreed to proceed.    History of Present Illness:  Anne Shaffer is a 37 y.o. who identifies as a female who was assigned female at birth, and is being seen today for an ongoing cough. She has underlying asthma with frequent exacerbations   She has been seen multiple times virtually for asthma/bronchitis/URI   Her cough is productive today   She has taken a home COVID test that was negative   She has been using her nebulizer with Albuterol on average twice daily   Last prednisone usage for asthma exacerbation was in May   She denies a fever  This cough has been present now for 2 weeks   She was also seen about a month ago for viral bronchial symptoms   She has used Mucinex without relief   She denies pregnancy or breastfeeding   Problems:  Patient Active Problem List   Diagnosis Date Noted   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   Migraine without aura and without status migrainosus, not intractable 11/01/2020   Encounter to establish care 05/31/2020   Moderate persistent asthma 05/31/2020   Post-COVID syndrome 05/31/2020   Essential hypertension    Anxiety 03/18/2018   Class 3 severe obesity due to excess calories without serious comorbidity with body mass index (BMI) of 50.0 to 59.9 in  adult [facility]) 03/18/2018   THYROID NODULE 12/03/2008    Allergies:  Allergies  Allergen Reactions   Penicillins Itching   Shellfish Allergy    Tomato Anxiety, Hives, Itching and Swelling   Shellfish Allergy    Medications:  Current Outpatient Medications:    albuterol (PROVENTIL) (2.5 MG/3ML) 0.083% nebulizer solution, Take 3 mLs (2.5 mg total) by nebulization every 6 (six) hours as needed for wheezing or shortness of breath., Disp: 150 mL, Rfl: 1    albuterol (VENTOLIN HFA) 108 (90 Base) MCG/ACT inhaler, Inhale 2 puffs into the lungs every 6 (six) hours as needed for wheezing or shortness of breath., Disp: 8 g, Rfl: 0   amLODipine (NORVASC) 5 MG tablet, TAKE 1 TABLET(5 MG) BY MOUTH DAILY, Disp: 90 tablet, Rfl: 0   benzonatate (TESSALON PERLES) 100 MG capsule, Take 1 capsule (100 mg total) by mouth 3 (three) times daily as needed for cough., Disp: 20 capsule, Rfl: 0   brompheniramine-pseudoephedrine-DM 30-2-10 MG/5ML syrup, Take 5 mLs by mouth 4 (four) times daily as needed., Disp: 120 mL, Rfl: 0   fexofenadine (ALLEGRA) 180 MG tablet, Take 1 tablet (180 mg total) by mouth daily., Disp: 90 tablet, Rfl: 0   fluticasone (FLONASE) 50 MCG/ACT nasal spray, Place 2 sprays into both nostrils daily., Disp: 16 g, Rfl: 0   hydrochlorothiazide (HYDRODIURIL) 25 MG tablet, TAKE 1 TABLET BY MOUTH EVERY DAY, Disp: 90 tablet, Rfl: 0   montelukast (SINGULAIR) 10 MG tablet, TAKE 1 TABLET(10 MG) BY MOUTH AT BEDTIME, Disp: 30 tablet, Rfl: 0   olopatadine (PATANOL) 0.1 % ophthalmic solution, Place 1 drop into both eyes 2 (two) times daily., Disp: 5 mL, Rfl: 0   predniSONE (DELTASONE) 20 MG tablet, Take 2 tablets (40 mg total) by mouth daily with breakfast., Disp: 10 tablet, Rfl: 0   Rimegepant Sulfate (NURTEC) 75 MG TBDP, Take 75 mg by mouth as needed (Migraine)., Disp: 8 tablet, Rfl: 11   traZODone (DESYREL) 50 MG tablet, Take 0.5-1 tablets (25-50 mg total) by mouth at bedtime as needed for sleep., Disp: 30 tablet, Rfl: 3   Vitamin D, Ergocalciferol, (DRISDOL) 1.25 MG (50000 UNIT) CAPS capsule, TAKE 1 CAPSULE BY MOUTH EVERY 7 DAYS, Disp: 12 capsule, Rfl: 1  Observations/Objective: Patient is well-developed, well-nourished in no acute distress.  Resting comfortably  at home.  Head is normocephalic, atraumatic.  No labored breathing.  Speech is clear and coherent with logical content.  Patient is alert and oriented at baseline.    Assessment and Plan:  1.  Acute viral bronchitis  - azithromycin (ZITHROMAX) 250 MG tablet; Take 2 tablets on day 1, then 1 tablet daily on days 2 through 5  Dispense: 6 tablet; Refill: 0 - predniSONE (DELTASONE) 20 MG tablet; Take 1 tablet (20 mg total) by mouth 2 (two) times daily with a meal for 5 days.  Dispense: 10 tablet; Refill: 0 - albuterol (VENTOLIN HFA) 108 (90 Base) MCG/ACT inhaler; Inhale 2 puffs into the lungs every 6 (six) hours as needed for wheezing or shortness of breath.  Dispense: 8 g; Refill: 0    Use nebulizer or HFA every 4-6 hours for relief (one or the other as discussed)  Follow up with PCP to discuss asthma control and frequent bronchitis    Follow Up Instructions: I discussed the assessment and treatment plan with the patient. The patient was provided an opportunity to ask questions and all were answered. The patient agreed with the plan and demonstrated an understanding of the instructions.  A copy of instructions were sent to the patient via MyChart unless otherwise noted below.    The patient was advised to call back or seek an in-person evaluation if the symptoms worsen or if the condition fails to improve as anticipated.  Time:  I spent 13 minutes with the patient via telehealth technology discussing the above problems/concerns.    Viviano Simas, FNP

## 2022-10-07 ENCOUNTER — Other Ambulatory Visit: Payer: Self-pay | Admitting: Nurse Practitioner

## 2022-10-07 DIAGNOSIS — J208 Acute bronchitis due to other specified organisms: Secondary | ICD-10-CM

## 2022-10-12 ENCOUNTER — Emergency Department (HOSPITAL_BASED_OUTPATIENT_CLINIC_OR_DEPARTMENT_OTHER): Payer: Self-pay | Admitting: Radiology

## 2022-10-12 ENCOUNTER — Other Ambulatory Visit: Payer: Self-pay

## 2022-10-12 ENCOUNTER — Emergency Department (HOSPITAL_BASED_OUTPATIENT_CLINIC_OR_DEPARTMENT_OTHER)
Admission: EM | Admit: 2022-10-12 | Discharge: 2022-10-12 | Disposition: A | Discharge status: Home / Self Care | Payer: Self-pay | Attending: Emergency Medicine | Admitting: Emergency Medicine

## 2022-10-12 ENCOUNTER — Emergency Department (HOSPITAL_BASED_OUTPATIENT_CLINIC_OR_DEPARTMENT_OTHER): Payer: Self-pay

## 2022-10-12 ENCOUNTER — Encounter (HOSPITAL_BASED_OUTPATIENT_CLINIC_OR_DEPARTMENT_OTHER): Payer: Self-pay | Admitting: Emergency Medicine

## 2022-10-12 DIAGNOSIS — J45909 Unspecified asthma, uncomplicated: Secondary | ICD-10-CM | POA: Insufficient documentation

## 2022-10-12 DIAGNOSIS — E079 Disorder of thyroid, unspecified: Secondary | ICD-10-CM | POA: Insufficient documentation

## 2022-10-12 DIAGNOSIS — Z1152 Encounter for screening for COVID-19: Secondary | ICD-10-CM | POA: Insufficient documentation

## 2022-10-12 LAB — COMPREHENSIVE METABOLIC PANEL
ALT: 17 U/L (ref 0–44)
AST: 15 U/L (ref 15–41)
Albumin: 4 g/dL (ref 3.5–5.0)
Alkaline Phosphatase: 64 U/L (ref 38–126)
Anion gap: 6 (ref 5–15)
BUN: 7 mg/dL (ref 6–20)
CO2: 32 mmol/L (ref 22–32)
Calcium: 9.1 mg/dL (ref 8.9–10.3)
Chloride: 99 mmol/L (ref 98–111)
Creatinine, Ser: 0.62 mg/dL (ref 0.44–1.00)
GFR, Estimated: >60 mL/min (ref >60)
Glucose, Bld: 127 mg/dL — ABNORMAL HIGH (ref 70–99)
Potassium: 4.3 mmol/L (ref 3.5–5.1)
Sodium: 137 mmol/L (ref 135–145)
Total Bilirubin: 0.6 mg/dL (ref 0.3–1.2)
Total Protein: 7.1 g/dL (ref 6.5–8.1)

## 2022-10-12 LAB — CBC
HCT: 38.5 % (ref 36.0–46.0)
Hemoglobin: 11.3 g/dL — ABNORMAL LOW (ref 12.0–15.0)
MCH: 23.7 pg — ABNORMAL LOW (ref 26.0–34.0)
MCHC: 29.4 g/dL — ABNORMAL LOW (ref 30.0–36.0)
MCV: 80.9 fL (ref 80.0–100.0)
Platelets: 223 10*3/uL (ref 150–400)
RBC: 4.76 MIL/uL (ref 3.87–5.11)
RDW: 15.4 % (ref 11.5–15.5)
WBC: 5.4 10*3/uL (ref 4.0–10.5)
nRBC: 0 % (ref 0.0–0.2)

## 2022-10-12 LAB — T4, FREE: Free T4: 0.61 ng/dL (ref 0.61–1.12)

## 2022-10-12 LAB — BRAIN NATRIURETIC PEPTIDE: B Natriuretic Peptide: 11.6 pg/mL (ref 0.0–100.0)

## 2022-10-12 LAB — SARS CORONAVIRUS 2 BY RT PCR: SARS Coronavirus 2 by RT PCR: NEGATIVE

## 2022-10-12 LAB — TSH: TSH: 1.372 u[IU]/mL (ref 0.350–4.500)

## 2022-10-12 MED ORDER — IOHEXOL 300 MG/ML  SOLN
100.0000 mL | Freq: Once | INTRAMUSCULAR | Status: AC | PRN
  Administered 2022-10-12: 100 mL via INTRAVENOUS

## 2022-10-12 NOTE — ED Provider Notes (Signed)
[facility] EMERGENCY DEPARTMENT AT [facility] Provider Note   CSN: [id number] Arrival date & time: 10/12/22  9563     History {Add pertinent medical, surgical, social history, OB history to HPI:1} Chief Complaint  Patient presents with   Cough   Shortness of Breath    Anne Shaffer is a 37 y.o. female.  37 year old female with history of anxiety and asthma who presents to the emergency department with cough and shortness of breath.  Patient reports over the past month she has had nocturnal cough and shortness of breath.  On 09/17/2022 she was diagnosed with bronchitis via telemedicine visit and was given prednisone and Z-Pak.  Reports that it did help somewhat.  Says she has had persistent nocturnal cough and shortness of breath that is productive of green sputum.  Says she is very anxious because her grandfather died of lung cancer and woke up short of breath so decided to come into the emergency department today for evaluation.  No history of intubations for asthma.  Does not smoke or vape.       Home Medications Prior to Admission medications   Medication Sig Start Date End Date Taking? Authorizing Provider  albuterol (PROVENTIL) (2.5 MG/3ML) 0.083% nebulizer solution Take 3 mLs (2.5 mg total) by nebulization every 6 (six) hours as needed for wheezing or shortness of breath. 08/12/22   Daphine Deutscher, Mary-Margaret, FNP  albuterol (VENTOLIN HFA) 108 (90 Base) MCG/ACT inhaler Inhale 2 puffs into the lungs every 6 (six) hours as needed for wheezing or shortness of breath. 09/17/22   Viviano Simas, FNP  amLODipine (NORVASC) 5 MG tablet TAKE 1 TABLET(5 MG) BY MOUTH DAILY 12/16/20   Early, Sung Amabile, NP  benzonatate (TESSALON PERLES) 100 MG capsule Take 1 capsule (100 mg total) by mouth 3 (three) times daily as needed for cough. 08/12/22   Bennie Pierini, FNP  brompheniramine-pseudoephedrine-DM 30-2-10 MG/5ML syrup Take 5 mLs by mouth 4 (four) times daily as needed. 02/19/22   Freddy Finner, NP  fexofenadine (ALLEGRA) 180 MG tablet Take 1 tablet (180 mg total) by mouth daily. 05/21/22   Freddy Finner, NP  fluticasone (FLONASE) 50 MCG/ACT nasal spray Place 2 sprays into both nostrils daily. 05/21/22   Freddy Finner, NP  hydrochlorothiazide (HYDRODIURIL) 25 MG tablet TAKE 1 TABLET BY MOUTH EVERY DAY 12/03/20   Early, Sung Amabile, NP  montelukast (SINGULAIR) 10 MG tablet TAKE 1 TABLET(10 MG) BY MOUTH AT BEDTIME 10/29/21   [name], PA-C  olopatadine (PATANOL) 0.1 % ophthalmic solution Place 1 drop into both eyes 2 (two) times daily. 05/21/22   Freddy Finner, NP  Rimegepant Sulfate (NURTEC) 75 MG TBDP Take 75 mg by mouth as needed (Migraine). 11/29/20   Tollie Eth, NP  traZODone (DESYREL) 50 MG tablet Take 0.5-1 tablets (25-50 mg total) by mouth at bedtime as needed for sleep. 11/01/20   Tollie Eth, NP  Vitamin D, Ergocalciferol, (DRISDOL) 1.25 MG (50000 UNIT) CAPS capsule TAKE 1 CAPSULE BY MOUTH EVERY 7 DAYS 09/24/20   [place], Buren Kos, MD      Allergies    Penicillins, Shellfish allergy, Tomato, and Shellfish allergy    Review of Systems   Review of Systems  Physical Exam Updated Vital Signs BP (!) 174/112 (BP Location: Right Arm)   Pulse (!) 103   Temp 97.6 F (36.4 C) (Oral)   Resp (!) 22   Ht 5\' 5"  (1.651 m)   Wt (!) 183.3 kg  SpO2 96%   BMI 67.23 kg/m  Physical Exam Vitals and nursing note reviewed.  Constitutional:      General: She is not in acute distress.    Appearance: She is well-developed.  HENT:     Head: Normocephalic and atraumatic.     Right Ear: External ear normal.     Left Ear: External ear normal.     Nose: Nose normal.  Eyes:     Extraocular Movements: Extraocular movements intact.     Conjunctiva/sclera: Conjunctivae normal.     Pupils: Pupils are equal, round, and reactive to light.  Cardiovascular:     Rate and Rhythm: Regular rhythm. Tachycardia present.     Heart sounds: No murmur heard. Pulmonary:     Effort:  Pulmonary effort is normal. No respiratory distress.     Breath sounds: Normal breath sounds.  Musculoskeletal:     Cervical back: Normal range of motion and neck supple.     Right lower leg: Edema (1+) present.     Left lower leg: Edema (1+) present.  Skin:    General: Skin is warm and dry.  Neurological:     Mental Status: She is alert and oriented to person, place, and time. Mental status is at baseline.  Psychiatric:     Comments: Anxious     ED Results / Procedures / Treatments   Labs (all labs ordered are listed, but only abnormal results are displayed) Labs Reviewed  SARS CORONAVIRUS 2 BY RT PCR    EKG None  Radiology No results found.  Procedures Procedures  {Document cardiac monitor, telemetry assessment procedure when appropriate:1}  Medications Ordered in ED Medications - No data to display  ED Course/ Medical Decision Making/ A&P   {   Click here for ABCD2, HEART and other calculatorsREFRESH Note before signing :1}                              Medical Decision Making Amount and/or Complexity of Data Reviewed Radiology: ordered.   Anne Shaffer is a 37 y.o. female with comorbidities that complicate the patient evaluation including asthma without intubations who presents to the emergency department with a month of shortness of breath and productive cough  Initial Ddx:  Asthma, reflux, pneumonia, URI, CHF, PE  MDM/Course:  Patient presents to the emergency department with approximately a month of shortness of breath and cough that is nocturnal.  On exam is overall well-appearing.  I do not appreciate any wheezing.  Does have some lower extremity edema as well.  Recommended that we obtain labs at this time to evaluate for CHF and PE given the fact that she has some lower extremity swelling and clear lungs.  Patient declined and feel that she does have capacity to do so.  Chest x-ray and COVID/flu were obtained and***. upon re-evaluation ***  This patient  presents to the ED for concern of complaints listed in HPI, this involves an extensive number of treatment options, and is a complaint that carries with it a high risk of complications and morbidity. Disposition including potential need for admission considered.   Dispo: {Disposition:28069}  Additional history obtained from {Additional History:28067} Records reviewed {Records Reviewed:28068} The following labs were independently interpreted: {labs interpreted:28064} and show {lab findings:28250} I independently reviewed the following imaging with scope of interpretation limited to determining acute life threatening conditions related to emergency care: {imaging interpreted:28065} and agree with the radiologist interpretation with the  following exceptions: none I personally reviewed and interpreted cardiac monitoring: {cardiac monitoring:28251} I personally reviewed and interpreted the pt's EKG: see above for interpretation  I have reviewed the patients home medications and made adjustments as needed Consults: {Consultants:28063} Social Determinants of health:  ***   {Document critical care time when appropriate:1} {Document review of labs and clinical decision tools ie heart score, Chads2Vasc2 etc:1}  {Document your independent review of radiology images, and any outside records:1} {Document your discussion with family members, caretakers, and with consultants:1} {Document social determinants of health affecting pt's care:1} {Document your decision making why or why not admission, treatments were needed:1} Final Clinical Impression(s) / ED Diagnoses Final diagnoses:  None    Rx / DC Orders ED Discharge Orders     None

## 2022-10-12 NOTE — ED Triage Notes (Signed)
Pt arrives to ED with c/o cough for several months with associated SOB that started today. Hx asthma and viral bronchitis.

## 2022-10-12 NOTE — ED Notes (Signed)
Patient transported to CT 

## 2022-10-12 NOTE — Discharge Instructions (Addendum)
You were seen for your shortness of breath and cough in the emergency department.  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  At home, please sleep propped up with several pillows if you are having lots of coughing and shortness of breath at night.    Follow-up with the [facility] ear nose and throat team next Wednesday (10/21/2022 at 1 PM) at the [facility].  Return immediately to the emergency department if you experience any of the following: Difficulty breathing, or any other concerning symptoms.    Thank you for visiting our Emergency Department. It was a pleasure taking care of you today.

## 2022-12-04 HISTORY — PX: THYROIDECTOMY: SHX17

## 2022-12-15 ENCOUNTER — Other Ambulatory Visit: Payer: Self-pay | Admitting: Nurse Practitioner

## 2022-12-15 DIAGNOSIS — J208 Acute bronchitis due to other specified organisms: Secondary | ICD-10-CM

## 2023-01-03 DIAGNOSIS — C73 Malignant neoplasm of thyroid gland: Secondary | ICD-10-CM

## 2023-01-03 HISTORY — DX: Malignant neoplasm of thyroid gland: C73

## 2023-01-15 DIAGNOSIS — C73 Malignant neoplasm of thyroid gland: Secondary | ICD-10-CM | POA: Insufficient documentation

## 2023-02-19 DIAGNOSIS — E0789 Other specified disorders of thyroid: Secondary | ICD-10-CM | POA: Diagnosis not present

## 2023-02-19 DIAGNOSIS — C73 Malignant neoplasm of thyroid gland: Secondary | ICD-10-CM | POA: Diagnosis not present

## 2023-02-19 HISTORY — PX: THYROIDECTOMY: SHX17

## 2023-02-20 DIAGNOSIS — C73 Malignant neoplasm of thyroid gland: Secondary | ICD-10-CM | POA: Diagnosis not present

## 2023-04-06 ENCOUNTER — Ambulatory Visit: Payer: Self-pay | Admitting: Nurse Practitioner

## 2023-04-06 DIAGNOSIS — J208 Acute bronchitis due to other specified organisms: Secondary | ICD-10-CM

## 2023-04-06 DIAGNOSIS — I10 Essential (primary) hypertension: Secondary | ICD-10-CM

## 2023-04-06 NOTE — Telephone Encounter (Signed)
 Copied from CRM [id number]. Topic: Clinical - Red Word Triage >> Apr 06, 2023  8:29 AM Tiffany B wrote: Red Word that prompted transfer to Nurse Triage: Patient experiencing anxiety due to medications and her grandmother transitioning.   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 Appt scheduled for Friday 3/7.   Reason for Disposition  MODERATE anxiety (e.g., persistent or frequent anxiety symptoms; interferes with sleep, school, or work)  Taking thyroid medications  Answer Assessment - Initial Assessment Questions 1. CONCERN: "Did anything happen that prompted you to call today?"     Taking medication for thyroid, diagnosed with thyroid cancer, and grandmother transitioning  2. ANXIETY SYMPTOMS: "Can you describe how you (your loved one; patient) have been feeling?" (e.g., tense, restless, panicky, anxious, keyed up, overwhelmed, sense of impending doom).      Overwhelmed, tense, anxious  3. ONSET: "How long have you been feeling this way?" (e.g., hours, days, weeks)     Past couple of weeks  4. SEVERITY: "How would you rate the level of anxiety?" (e.g., 0 - 10; or mild, moderate, severe).     Moderate level  5. FUNCTIONAL IMPAIRMENT: "How have these feelings affected your ability to do daily activities?" "Have you had more difficulty than usual doing your normal daily activities?" (e.g., getting better, same, worse; self-care, school,  work, interactions)     Unable to work, currently on leave.Marland Kitchengetting worse  6. HISTORY: "Have you felt this way before?" "Have you ever been diagnosed with an anxiety problem in the past?" (e.g., generalized anxiety disorder, panic attacks, PTSD). If Yes, ask: "How was this problem treated?" (e.g., medicines, counseling, etc.)     Histroy of anxiety and depression  7. RISK OF HARM - SUICIDAL IDEATION: "Do you ever have thoughts of hurting or killing yourself?" If Yes, ask:  "Do you have these feelings now?" "Do you have a plan on how you would do this?"     No  8. TREATMENT:  "What has been done so far to treat this anxiety?" (e.g., medicines, relaxation strategies). "What has helped?"     Sleeping helps, but feels like she is sleeping too much  9. TREATMENT - THERAPIST: "Do you have a counselor or therapist? Name?"     Not at this time  10. POTENTIAL TRIGGERS: "Do you drink caffeinated beverages (e.g., coffee, colas, teas), and how much daily?" "Do you drink alcohol or use any drugs?" "Have you started any new medicines recently?"       Likes to drink tea, doesn't drink alcohol, started Synthroid in January  11. PATIENT SUPPORT: "Who is with you now?" "Who do you live with?" "Do you have family or friends who you can talk to?"        Mom and family; Lives with husband   29. OTHER SYMPTOMS: "Do you have any other symptoms?" (e.g., feeling depressed, trouble concentrating, trouble sleeping, trouble breathing, palpitations or fast heartbeat, chest pain, sweating, nausea, or diarrhea)       Nausea  13. PREGNANCY: "Is there any chance you are pregnant?" "  When was your last menstrual period?"       Unsure, LMP December  Protocols used: Anxiety and Panic Attack-A-AH

## 2023-04-09 ENCOUNTER — Ambulatory Visit (INDEPENDENT_AMBULATORY_CARE_PROVIDER_SITE_OTHER): Payer: Self-pay | Admitting: Medical

## 2023-04-09 VITALS — BP 124/80 | HR 95 | Wt 391.6 lb

## 2023-04-09 DIAGNOSIS — Z9889 Other specified postprocedural states: Secondary | ICD-10-CM

## 2023-04-09 DIAGNOSIS — I10 Essential (primary) hypertension: Secondary | ICD-10-CM

## 2023-04-09 DIAGNOSIS — F419 Anxiety disorder, unspecified: Secondary | ICD-10-CM | POA: Diagnosis not present

## 2023-04-09 DIAGNOSIS — R7989 Other specified abnormal findings of blood chemistry: Secondary | ICD-10-CM

## 2023-04-09 MED ORDER — SERTRALINE HCL 50 MG PO TABS
50.0000 mg | ORAL_TABLET | Freq: Every day | ORAL | 1 refills | Status: DC
Start: 2023-04-09 — End: 2023-07-15

## 2023-04-09 NOTE — Progress Notes (Addendum)
 Subjective:  Anne Shaffer is a 38 y.o. female who presents for Chief Complaint  Patient presents with   Anxiety    Anxiety, just had full thyroidectomy- paperally cancer  and since this is on thyroid medication and her anxiety has been high since. Going to endocrinology next month. Been out of work since February 20th due anxiety and surgery. Had surgery in January and went back to work but got overwhelmed with dealing with recovery that she needed time off. Would like FMLA forms  Last visit with Skeet Latch was 2022.      Here for anxiety concerns.  She has had a rough couple months.  She was diagnosed with thyroid cancer and had 2 different surgeries recently.  She had an initial partial thyroidectomy and they had to go back in.  She had a large goiter that was pressing on her trachea as well.  She was started on thyroid medicine at 200 mcg daily.  She does not feel right on this dose.  The last blood test for thyroid she says was around January.  She has felt anxious some worrying and concerns.  She initially was taken out of work from February 19, 2023 through March 15, 2023.  She try to go back to work initially but felt uneasy.  She was not sure if it was just that worry and anxiety if it was related to the medication with the thyroid dose  She feels like she is getting close to being able to go back to work but she still feels not herself quite yet.  She has a history of asthma, and breathing has been fine  She was happy with her blood pressure reading today.  She is compliant with blood pressure medication.  She feels like she should probably be back on some medicine for anxiety.  She was on Zoloft about 2 years ago.  She does not recall what dose she was taking.  But the medicine did help.  She was seeing counseling at that time as well  In addition to her own health issues her grandmother is in hospice and just transition to hospice so she is worried about her grandmother as  well  No SI.  No other aggravating or relieving factors. No other complaint.    Family History  Problem Relation Age of Onset   Hyperlipidemia Maternal Grandmother    Diabetes Maternal Grandmother    Cancer Maternal Grandfather        prostate   Hyperlipidemia Maternal Grandfather    Early death Neg Hx    Stroke Neg Hx      No other aggravating or relieving factors.    No other c/o.  Past Medical History:  Diagnosis Date   Anxiety    Depression    DYSFUNCTIONAL UTERINE BLEEDING 12/03/2008   Qualifier: Diagnosis of  By: Huntley Dec, Scott     Essential hypertension    Migraines    Salivary calculus    Salivary calculus    Severe obesity (BMI >= 40) (HCC) 12/02/2012   Vitamin D deficiency    Current Outpatient Medications on File Prior to Visit  Medication Sig Dispense Refill   albuterol (PROVENTIL) (2.5 MG/3ML) 0.083% nebulizer solution Take 3 mLs (2.5 mg total) by nebulization every 6 (six) hours as needed for wheezing or shortness of breath. 150 mL 1   albuterol (VENTOLIN HFA) 108 (90 Base) MCG/ACT inhaler Inhale 2 puffs into the lungs every 6 (six) hours as needed for wheezing or  shortness of breath. 8 g 0   fluticasone (FLONASE) 50 MCG/ACT nasal spray Place 2 sprays into both nostrils daily. 16 g 0   levothyroxine (SYNTHROID) 200 MCG tablet Take 200 mcg by mouth daily before breakfast.     ondansetron (ZOFRAN-ODT) 4 MG disintegrating tablet Take 4 mg by mouth every 6 (six) hours as needed.     montelukast (SINGULAIR) 10 MG tablet TAKE 1 TABLET(10 MG) BY MOUTH AT BEDTIME (Patient not taking: Reported on 04/09/2023) 30 tablet 0   Rimegepant Sulfate (NURTEC) 75 MG TBDP Take 75 mg by mouth as needed (Migraine). (Patient not taking: Reported on 04/09/2023) 8 tablet 11   No current facility-administered medications on file prior to visit.     The following portions of the patient's history were reviewed and updated as appropriate: allergies, current medications, past family  history, past medical history, past social history, past surgical history and problem list.  ROS Otherwise as in subjective above    Objective: BP 124/80   Pulse 95   Wt (!) 391 lb 9.6 oz (177.6 kg)   BMI 65.17 kg/m   General appearance: alert, no distress, well developed, well nourished Psych: pleasant, tearful at times    Assessment: Encounter Diagnoses  Name Primary?   Essential hypertension Yes   Status post thyroid surgery    Abnormal thyroid blood test    Anxiety      Plan: We discussed her symptoms, concerns, recent diagnosis  Begin back on Sertraline Zoloft.  Start 50mg , 1/2 tablet daily the first week, then increase to whole tablet daily.  Discussed risk and benefits of medication.  Referral to counseling today  Updated thyroid labs since initiating thyroid medication in 02/2023.    We discussed FMLA and time out of work.  I will write her out for a short period of time as I recommend she get back to work in a reasonable period of time.  But given that she is initiating sertraline, probably needs dose adjusted her thyroid medicine and dealing with her mother going the hospice with her own recent thyroid cancer diagnosis, she may need a little bit more time.   Siyona was seen today for anxiety.  Diagnoses and all orders for this visit:  Essential hypertension  Status post thyroid surgery -     TSH + free T4  Abnormal thyroid blood test -     TSH + free T4  Anxiety  Other orders -     sertraline (ZOLOFT) 50 MG tablet; Take 1 tablet (50 mg total) by mouth daily.  Spent > 30 minutes face to face with patient in discussion of symptoms, evaluation, plan and recommendations.    Follow up: pending labs

## 2023-04-10 LAB — TSH+FREE T4
Free T4: 1.28 ng/dL (ref 0.82–1.77)
TSH: 2.28 u[IU]/mL (ref 0.450–4.500)

## 2023-04-11 NOTE — Progress Notes (Signed)
 Results sent through MyChart

## 2023-04-12 ENCOUNTER — Other Ambulatory Visit: Payer: Self-pay | Admitting: Nurse Practitioner

## 2023-04-12 ENCOUNTER — Encounter: Payer: Self-pay | Admitting: Nurse Practitioner

## 2023-04-12 ENCOUNTER — Telehealth: Payer: Self-pay | Admitting: Nurse Practitioner

## 2023-04-12 DIAGNOSIS — I10 Essential (primary) hypertension: Secondary | ICD-10-CM

## 2023-04-12 DIAGNOSIS — J208 Acute bronchitis due to other specified organisms: Secondary | ICD-10-CM

## 2023-04-12 MED ORDER — AMLODIPINE BESYLATE 5 MG PO TABS: ORAL_TABLET | ORAL | 0 refills | Status: DC

## 2023-04-12 MED ORDER — HYDROCHLOROTHIAZIDE 25 MG PO TABS: 25.0000 mg | ORAL_TABLET | Freq: Every day | ORAL | 0 refills | Status: DC

## 2023-04-12 NOTE — Progress Notes (Signed)
 Letter typed & emailed to pt

## 2023-04-12 NOTE — Telephone Encounter (Signed)
 [employer] Disability forms received and sent back in folder to [employer]

## 2023-04-12 NOTE — Telephone Encounter (Signed)
 Letter typed & emailed to pt

## 2023-04-12 NOTE — Telephone Encounter (Signed)
 Copied from CRM [id number]. Topic: General - Inquiry >> Apr 12, 2023  9:49 AM Haroldine Laws wrote: Reason for CRM: pt called asking how long will she need to be out of work?  CB@  [phone]

## 2023-04-13 DIAGNOSIS — Z9889 Other specified postprocedural states: Secondary | ICD-10-CM | POA: Insufficient documentation

## 2023-04-13 DIAGNOSIS — R7989 Other specified abnormal findings of blood chemistry: Secondary | ICD-10-CM | POA: Insufficient documentation

## 2023-04-13 MED ORDER — ALBUTEROL SULFATE HFA 108 (90 BASE) MCG/ACT IN AERS
2.0000 | INHALATION_SPRAY | Freq: Four times a day (QID) | RESPIRATORY_TRACT | 0 refills | Status: DC | PRN
Start: 2023-04-13 — End: 2023-12-16

## 2023-04-15 ENCOUNTER — Telehealth: Payer: Self-pay | Admitting: Family Medicine

## 2023-04-15 NOTE — Telephone Encounter (Signed)
 Copied from CRM [id number]. Topic: General - Other >> Apr 15, 2023  8:15 AM Elle L wrote: Reason for CRM: The patient's employer states that they have not received the FMLA forms from the office. She is also requesting to see what dates were put on the form. Her first day out of work was 2/21. Their fax number is [phone] attention to Osie Bond. Her call back number is [phone].

## 2023-04-16 NOTE — Telephone Encounter (Signed)
 I did not see these forms

## 2023-04-28 ENCOUNTER — Encounter (HOSPITAL_BASED_OUTPATIENT_CLINIC_OR_DEPARTMENT_OTHER): Payer: BC Managed Care – PPO | Admitting: Family Medicine

## 2023-05-04 ENCOUNTER — Encounter (HOSPITAL_BASED_OUTPATIENT_CLINIC_OR_DEPARTMENT_OTHER): Admitting: Family Medicine

## 2023-05-17 DIAGNOSIS — E890 Postprocedural hypothyroidism: Secondary | ICD-10-CM | POA: Diagnosis not present

## 2023-05-17 DIAGNOSIS — C73 Malignant neoplasm of thyroid gland: Secondary | ICD-10-CM | POA: Diagnosis not present

## 2023-07-06 ENCOUNTER — Encounter (HOSPITAL_BASED_OUTPATIENT_CLINIC_OR_DEPARTMENT_OTHER): Admitting: Family Medicine

## 2023-07-13 DIAGNOSIS — C73 Malignant neoplasm of thyroid gland: Secondary | ICD-10-CM | POA: Diagnosis not present

## 2023-07-14 DIAGNOSIS — C73 Malignant neoplasm of thyroid gland: Secondary | ICD-10-CM | POA: Diagnosis not present

## 2023-07-15 ENCOUNTER — Other Ambulatory Visit (HOSPITAL_BASED_OUTPATIENT_CLINIC_OR_DEPARTMENT_OTHER): Payer: Self-pay | Admitting: Family Medicine

## 2023-07-15 ENCOUNTER — Ambulatory Visit (INDEPENDENT_AMBULATORY_CARE_PROVIDER_SITE_OTHER): Admitting: Family Medicine

## 2023-07-15 VITALS — BP 127/74 | HR 79 | Ht 65.0 in | Wt 369.2 lb

## 2023-07-15 DIAGNOSIS — J4540 Moderate persistent asthma, uncomplicated: Secondary | ICD-10-CM

## 2023-07-15 DIAGNOSIS — Z13220 Encounter for screening for lipoid disorders: Secondary | ICD-10-CM

## 2023-07-15 DIAGNOSIS — I10 Essential (primary) hypertension: Secondary | ICD-10-CM

## 2023-07-15 DIAGNOSIS — Z7689 Persons encountering health services in other specified circumstances: Secondary | ICD-10-CM

## 2023-07-15 DIAGNOSIS — Z0000 Encounter for general adult medical examination without abnormal findings: Secondary | ICD-10-CM

## 2023-07-15 DIAGNOSIS — F419 Anxiety disorder, unspecified: Secondary | ICD-10-CM

## 2023-07-15 DIAGNOSIS — G43011 Migraine without aura, intractable, with status migrainosus: Secondary | ICD-10-CM | POA: Diagnosis not present

## 2023-07-15 DIAGNOSIS — C73 Malignant neoplasm of thyroid gland: Secondary | ICD-10-CM | POA: Diagnosis not present

## 2023-07-15 MED ORDER — MONTELUKAST SODIUM 10 MG PO TABS: ORAL_TABLET | ORAL | 2 refills | Status: DC

## 2023-07-15 MED ORDER — AMLODIPINE BESYLATE 5 MG PO TABS: ORAL_TABLET | ORAL | 2 refills | Status: DC

## 2023-07-15 MED ORDER — HYDROCHLOROTHIAZIDE 25 MG PO TABS: 25.0000 mg | ORAL_TABLET | Freq: Every day | ORAL | 2 refills | Status: DC

## 2023-07-15 MED ORDER — SERTRALINE HCL 50 MG PO TABS: 50.0000 mg | ORAL_TABLET | Freq: Every day | ORAL | 2 refills | Status: DC

## 2023-07-15 MED ORDER — NURTEC 75 MG PO TBDP
75.0000 mg | ORAL_TABLET | ORAL | 5 refills | Status: AC | PRN
Start: 2023-07-15 — End: ?

## 2023-07-15 NOTE — Progress Notes (Signed)
 New Patient Office Visit  Subjective:   Anne Shaffer Nov 16, 1985 07/15/2023  Chief Complaint  Patient presents with   New Patient (Initial Visit)    Patient is here today to get established with the practice. Denies any main concerns for today's visit.    HPI: Anne Shaffer presents today to establish care at [facility] at [facility] Introduced to Publishing rights manager role and practice setting.  All questions answered.   Last PCP: Archibald Beard, NP Concerns: See below    She is requesting a referral to OBGYN to establish care.   HYPERTENSION: Anne Shaffer presents for the medical management of hypertension.  Patient's current hypertension medication regimen is: hydrochlorothiazide  25mg , amlodipine  5mg   Patient is  currently taking prescribed medications for HTN.  Patient is not regularly keeping a check on BP at home.  Adhering to low sodium diet: Yes, following low iodine diet due to RAI with thyroid  cancer Exercising Regularly: Yes, working out more  Denies headache, dizziness, CP, SHOB, vision changes.   BP Readings from Last 3 Encounters:  07/15/23 127/74  04/09/23 124/80  10/12/22 (!) 141/86    MIGRAINES:  Reports getting migraine 1-2x a month currently and is on Nurtec as needed. Reports alleviation of stress has helped with reducing frequency of her migraines.   PAPILLARY THYROIDECTOMY:  Patient is currently managed by 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 She is currently taking levothyroxine 250mcg. She will return to Endocrinology in 3-4 months.   ANXIETY: Anne Shaffer presents for the medical management of anxiety.  Current medication regimen: Sertraline  50mg  daily  Well controlled: Yes, reports improvement of recent stressors.   Denies SI/HI.     07/15/2023    2:48 PM 04/09/2023   12:14 PM 11/29/2020    3:32 PM 05/31/2020    2:45 PM  GAD 7 : Generalized Anxiety Score  Nervous, Anxious, on Edge 1 3 0 1  Control/stop worrying 0 2 0 1  Worry too much - different things 1 2 1 2   Trouble relaxing 1 1 1 2   Restless 0 1 0 0  Easily annoyed or irritable 0 3 1 1   Afraid - awful might happen 0 2 0 0  Total GAD 7 Score 3 14 3 7   Anxiety Difficulty Not difficult at all Somewhat difficult Not difficult at all Somewhat difficult      07/15/2023    2:47 PM 04/09/2023   12:13 PM 11/29/2020    3:30 PM 05/31/2020    2:45 PM 03/16/2018   10:01 AM  Depression screen PHQ 2/9  Decreased Interest 0 1 1 2  0  Down, Depressed, Hopeless 1 1 0 2 1  PHQ - 2 Score 1 2 1 4 1   Altered sleeping 0 3 0 3   Tired, decreased energy 3 3 1 2    Change in appetite 1 1 1 1    Feeling bad or failure about yourself  0 1 0 0   Trouble concentrating 1 3 0 0   Moving slowly or fidgety/restless 0 0 0 0   Suicidal thoughts 0 0 0 0   PHQ-9 Score 6 13 3 10    Difficult doing work/chores Not difficult at all Somewhat difficult Somewhat difficult Somewhat difficult         The following portions of  the patient's history were reviewed and updated as appropriate: past medical history, past surgical history, family history, social history, allergies, medications, and problem list.   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   Migraine without aura and without status migrainosus, not intractable 11/01/2020   Encounter to establish care 05/31/2020   Moderate persistent asthma 05/31/2020   Post-COVID syndrome 05/31/2020   Essential hypertension    Anxiety 03/18/2018   Class 3 severe obesity due to excess calories without serious comorbidity with body mass  index (BMI) of 50.0 to 59.9 in adult 03/18/2018   THYROID  NODULE 12/03/2008   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   Qualifier: Diagnosis of  By: Earle Glatter, Scott     Essential hypertension    Migraines    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   Salivary calculus    Salivary calculus    Severe obesity (BMI >= 40) (HCC) 12/02/2012   Vitamin D  deficiency    Past Surgical History:  Procedure Laterality Date   THYROIDECTOMY  12/04/2022   THYROIDECTOMY  02/19/2023   Family History  Problem Relation Age of Onset   Thyroid  disease Maternal Aunt    Hyperlipidemia Maternal Grandmother    Diabetes Maternal Grandmother    Hypertension Maternal Grandmother    Cancer Maternal Grandfather        prostate   Hyperlipidemia Maternal Grandfather    Early death Neg Hx    Stroke Neg Hx    Social History   Socioeconomic History   Marital status: Married    Spouse name: Not on file   Number of children: Not on file   Years of education: Not on file   Highest education level: Associate degree: academic program  Occupational History   Not on file  Tobacco Use   Smoking status: Former   Smokeless tobacco: Never  Substance and Sexual Activity   Alcohol use: Yes    Comment: social   Drug use: No   Sexual activity: Yes    Birth control/protection: None  Other Topics Concern   Not on file  Social History Narrative   ** Merged History Encounter **       Social Drivers of Health   Financial Resource Strain: Low Risk  (07/15/2023)   Overall Financial Resource Strain (CARDIA)    Difficulty of Paying Living Expenses: Not very hard  Food Insecurity: No Food Insecurity (07/15/2023)   Hunger Vital Sign    Worried About Running Out of Food in the Last Year: Never true    Ran Out of Food in the Last Year: Never true  Transportation Needs: No Transportation Needs (07/15/2023)   PRAPARE - Therapist, art (Medical): No    Lack of Transportation (Non-Medical): No  Physical Activity: Sufficiently Active (07/15/2023)   Exercise Vital Sign    Days of Exercise per Week: 4 days    Minutes of Exercise per Session: 60 min  Stress: No Stress Concern Present (07/15/2023)   [employer] of Occupational Health - Occupational Stress Questionnaire    Feeling of Stress: Only a little  Social Connections: Moderately Isolated (07/15/2023)   Social Connection and Isolation Panel    Frequency  of Communication with Friends and Family: More than three times a week    Frequency of Social Gatherings with Friends and Family: Once a week    Attends Religious Services: More than 4 times per year    Active Member of [employer] or Organizations: No    Attends Engineer, structural: Not on file    Marital Status: Separated  Intimate Partner Violence: Unknown (05/08/2021)   Received from [facility]   HITS    Physically Hurt: Not on file    Insult or Talk Down To: Not on file    Threaten Physical Harm: Not on file    Scream or Curse: Not on file   Outpatient Medications Prior to Visit  Medication Sig Dispense Refill   albuterol  (PROVENTIL ) (2.5 MG/3ML) 0.083% nebulizer solution Take 3 mLs (2.5 mg total) by nebulization every 6 (six) hours as needed for wheezing or shortness of breath. 150 mL 1   albuterol  (VENTOLIN  HFA) 108 (90 Base) MCG/ACT inhaler Inhale 2 puffs into the lungs every 6 (six) hours as needed for wheezing or shortness of breath. 8 g 0   fluticasone  (FLONASE ) 50 MCG/ACT nasal spray Place 2 sprays into both nostrils daily. 16 g 0   levothyroxine (SYNTHROID) 200 MCG tablet Take 200 mcg by mouth daily before breakfast.     ondansetron  (ZOFRAN -ODT) 4 MG disintegrating tablet Take 4 mg by mouth every 6 (six) hours as needed.     amLODipine  (NORVASC ) 5 MG tablet TAKE 1 TABLET(5 MG) BY MOUTH DAILY 90 tablet 0   hydrochlorothiazide  (HYDRODIURIL ) 25 MG tablet Take 1 tablet (25  mg total) by mouth daily. 90 tablet 0   sertraline  (ZOLOFT ) 50 MG tablet Take 1 tablet (50 mg total) by mouth daily. 30 tablet 1   montelukast  (SINGULAIR ) 10 MG tablet TAKE 1 TABLET(10 MG) BY MOUTH AT BEDTIME (Patient not taking: Reported on 07/15/2023) 30 tablet 0   Rimegepant Sulfate (NURTEC) 75 MG TBDP Take 75 mg by mouth as needed (Migraine). (Patient not taking: Reported on 07/15/2023) 8 tablet 11   No facility-administered medications prior to visit.   Allergies  Allergen Reactions   Penicillins Itching   Shellfish Allergy    Tomato Anxiety, Hives, Itching and Swelling   Shellfish Allergy     ROS: A complete ROS was performed with pertinent positives/negatives noted in the HPI. The remainder of the ROS are negative.   Objective:   Today's Vitals   07/15/23 1441  BP: 127/74  Pulse: 79  SpO2: 99%  Weight: (!) 369 lb 3.2 oz (167.5 kg)  Height: 5' 5 (1.651 m)    GENERAL: Well-appearing, in NAD. Well nourished.  SKIN: Pink, warm and dry.  Head: Normocephalic. NECK: Trachea midline. Full ROM w/o pain or tenderness. RESPIRATORY: Chest wall symmetrical. Respirations even and non-labored. Breath sounds clear to auscultation bilaterally.  CARDIAC: S1, S2 present, regular rate and rhythm without murmur or gallops. Peripheral pulses 2+ bilaterally.  MSK: Muscle tone and strength appropriate for age.  NEUROLOGIC: No motor or sensory deficits. Steady, even gait. C2-C12 intact.  PSYCH/MENTAL STATUS: Alert, oriented x 3. Cooperative, appropriate mood and affect.       Assessment & Plan:  1. Essential hypertension (Primary) Controlled. Continue current regimen, heart healthy diet, regular exercise and monitor home BP. Refills sent.  - amLODipine  (NORVASC ) 5 MG tablet; TAKE 1 TABLET(5 MG) BY MOUTH DAILY  Dispense: 90 tablet; Refill: 2 - hydrochlorothiazide  (HYDRODIURIL ) 25 MG tablet; Take 1 tablet (25 mg total) by  mouth daily.  Dispense: 90 tablet; Refill: 2  2. Moderate persistent  asthma, unspecified whether complicated Controlled per patient. Singulair  refill requested.  - montelukast  (SINGULAIR ) 10 MG tablet; TAKE 1 TABLET(10 MG) BY MOUTH AT BEDTIME  Dispense: 90 tablet; Refill: 2  3. Intractable migraine without aura and with status migrainosus Controlled per patient. Nurtec refilled. Will continue to avoid triggers and reports improvement with stress.  - Rimegepant Sulfate (NURTEC) 75 MG TBDP; Take 1 tablet (75 mg total) by mouth as needed (Migraine).  Dispense: 10 tablet; Refill: 5  4. Encounter to establish care with new doctor Referral placed to OBGYN per patient request to complete pap.  - Ambulatory referral to Obstetrics / Gynecology  5. Anxiety Controlled per patient. Stress has improved. Continue Sertraline  50mg  as directed and counseling recommended. Safety plan reviewed.  - sertraline  (ZOLOFT ) 50 MG tablet; Take 1 tablet (50 mg total) by mouth daily.  Dispense: 90 tablet; Refill: 2    Patient to reach out to office if new, worrisome, or unresolved symptoms arise or if no improvement in patient's condition. Patient verbalized understanding and is agreeable to treatment plan. All questions answered to patient's satisfaction.    Return in about 3 months (around 10/15/2023) for ANNUAL PHYSICAL, HYPERTENSION (fasting labs prior) .    Nonda Bays, [place]

## 2023-07-16 DIAGNOSIS — C73 Malignant neoplasm of thyroid gland: Secondary | ICD-10-CM | POA: Diagnosis not present

## 2023-07-17 ENCOUNTER — Encounter (HOSPITAL_BASED_OUTPATIENT_CLINIC_OR_DEPARTMENT_OTHER): Payer: Self-pay | Admitting: Family Medicine

## 2023-07-23 DIAGNOSIS — C73 Malignant neoplasm of thyroid gland: Secondary | ICD-10-CM | POA: Diagnosis not present

## 2023-09-11 ENCOUNTER — Telehealth: Admitting: Nurse Practitioner

## 2023-09-11 DIAGNOSIS — N921 Excessive and frequent menstruation with irregular cycle: Secondary | ICD-10-CM

## 2023-09-11 MED ORDER — IBUPROFEN 800 MG PO TABS: 800.0000 mg | ORAL_TABLET | Freq: Three times a day (TID) | ORAL | 0 refills | Status: DC | PRN

## 2023-09-11 NOTE — Patient Instructions (Signed)
 Anne Shaffer, thank you for joining [name], NP for today's virtual visit.  While this provider is not your primary care provider (PCP), if your PCP is located in our provider database this encounter information will be shared with them immediately following your visit.   A [facility] MyChart account gives you access to today's visit and all your visits, tests, and labs performed at [facility]  click here if you don't have a [facility] MyChart account or go to [email or web address]  Consent: (Patient) Anne Shaffer provided verbal consent for this virtual visit at the beginning of the encounter.  Current Medications:  Current Outpatient Medications:    ibuprofen  (ADVIL ) 800 MG tablet, Take 1 tablet (800 mg total) by mouth every 8 (eight) hours as needed., Disp: 60 tablet, Rfl: 0   albuterol  (PROVENTIL ) (2.5 MG/3ML) 0.083% nebulizer solution, Take 3 mLs (2.5 mg total) by nebulization every 6 (six) hours as needed for wheezing or shortness of breath., Disp: 150 mL, Rfl: 1   albuterol  (VENTOLIN  HFA) 108 (90 Base) MCG/ACT inhaler, Inhale 2 puffs into the lungs every 6 (six) hours as needed for wheezing or shortness of breath., Disp: 8 g, Rfl: 0   amLODipine  (NORVASC ) 5 MG tablet, TAKE 1 TABLET(5 MG) BY MOUTH DAILY, Disp: 90 tablet, Rfl: 2   fluticasone  (FLONASE ) 50 MCG/ACT nasal spray, Place 2 sprays into both nostrils daily., Disp: 16 g, Rfl: 0   hydrochlorothiazide  (HYDRODIURIL ) 25 MG tablet, Take 1 tablet (25 mg total) by mouth daily., Disp: 90 tablet, Rfl: 2   levothyroxine (SYNTHROID) 200 MCG tablet, Take 200 mcg by mouth daily before breakfast., Disp: , Rfl:    montelukast  (SINGULAIR ) 10 MG tablet, TAKE 1 TABLET(10 MG) BY MOUTH AT BEDTIME, Disp: 90 tablet, Rfl: 2   ondansetron  (ZOFRAN -ODT) 4 MG disintegrating tablet, Take 4 mg by mouth every 6 (six) hours as needed., Disp: , Rfl:    Rimegepant Sulfate (NURTEC) 75 MG TBDP, Take 1 tablet (75 mg total) by mouth as  needed (Migraine)., Disp: 10 tablet, Rfl: 5   sertraline  (ZOLOFT ) 50 MG tablet, Take 1 tablet (50 mg total) by mouth daily., Disp: 90 tablet, Rfl: 2   Medications ordered in this encounter:  Meds ordered this encounter  Medications   ibuprofen  (ADVIL ) 800 MG tablet    Sig: Take 1 tablet (800 mg total) by mouth every 8 (eight) hours as needed.    Dispense:  60 tablet    Refill:  0    Supervising Provider:   BLAISE ALEENE KIDD [[id number]]     *If you need refills on other medications prior to your next appointment, please contact your pharmacy*  Follow-Up: Call back or seek an in-person evaluation if the symptoms worsen or if the condition fails to improve as anticipated.  [facility] [phone]  Other Instructions If no improvement in 2- 3 days she has been instructed to reach out to her PCP   If you have been instructed to have an in-person evaluation today at a local Urgent Care facility, please use the link below. It will take you to a list of all of our available [facility], including address, phone number and hours of operation. Please do not delay care.   Urgent Cares  If you or a family member do not have a primary care provider, use the link below to schedule a visit and establish care. When you choose a  primary care physician or  advanced practice provider, you gain a long-term partner in health. Find a Primary Care Provider  Learn more about [facility]'s in-office and virtual care options: [facility] - Get Care Now

## 2023-09-11 NOTE — Progress Notes (Signed)
 Virtual Visit Consent   Anne Shaffer, you are scheduled for a virtual visit with a [facility] provider today. Just as with appointments in the office, your consent must be obtained to participate. Your consent will be active for this visit and any virtual visit you may have with one of our providers in the next 365 days. If you have a MyChart account, a copy of this consent can be sent to you electronically.  As this is a virtual visit, video technology does not allow for your provider to perform a traditional examination. This may limit your provider's ability to fully assess your condition. If your provider identifies any concerns that need to be evaluated in person or the need to arrange testing (such as labs, EKG, etc.), we will make arrangements to do so. Although advances in technology are sophisticated, we cannot ensure that it will always work on either your end or our end. If the connection with a video visit is poor, the visit may have to be switched to a telephone visit. With either a video or telephone visit, we are not always able to ensure that we have a secure connection.  By engaging in this virtual visit, you consent to the provision of healthcare and authorize for your insurance to be billed (if applicable) for the services provided during this visit. Depending on your insurance coverage, you may receive a charge related to this service.  I need to obtain your verbal consent now. Are you willing to proceed with your visit today? Anne Shaffer has provided verbal consent on 09/11/2023 for a virtual visit (video or telephone). [name], NP  Date: 09/11/2023 4:32 PM   Virtual Visit via Video Note   I, [name], connected with  Anne Shaffer  ([id number], 06-22-1985) on 09/11/23 at  4:30 PM EDT by a video-enabled telemedicine application and verified that I am speaking with the correct person using two identifiers.  Location: Patient: Virtual Visit Location Patient:  Home Provider: Virtual Visit Location Provider: Home Office   I discussed the limitations of evaluation and management by telemedicine and the availability of in person appointments. The patient expressed understanding and agreed to proceed.    History of Present Illness: Anne Shaffer is a 38 y.o. who identifies as a female who was assigned female at birth, and is being seen today for heavy menstrual bleeding.  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 She skipped her menstrual cycle last month and it came on last Saturday and her flow has been very heavy over the past 7days. She has associated fatigue and malaise with this as well.      Problems:  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   Migraine without aura and without status migrainosus, not intractable 11/01/2020   Encounter to establish care 05/31/2020   Moderate persistent asthma 05/31/2020   Post-COVID syndrome 05/31/2020   Essential hypertension    Anxiety 03/18/2018   Class 3 severe obesity due to excess calories without serious comorbidity with body mass index (BMI) of 50.0 to 59.9 in adult 03/18/2018   THYROID  NODULE 12/03/2008    Allergies:  Allergies  Allergen Reactions   Penicillins Itching   Shellfish Allergy    Tomato Anxiety,  Hives, Itching and Swelling   Shellfish Allergy    Medications:  Current Outpatient Medications:    ibuprofen  (ADVIL ) 800 MG tablet, Take 1 tablet (800 mg total) by mouth every 8 (eight) hours as needed., Disp: 60 tablet, Rfl: 0   albuterol  (PROVENTIL ) (2.5 MG/3ML) 0.083% nebulizer solution, Take 3 mLs (2.5 mg total) by nebulization every 6 (six) hours as needed for wheezing or shortness of breath., Disp: 150 mL, Rfl: 1   albuterol   (VENTOLIN  HFA) 108 (90 Base) MCG/ACT inhaler, Inhale 2 puffs into the lungs every 6 (six) hours as needed for wheezing or shortness of breath., Disp: 8 g, Rfl: 0   amLODipine  (NORVASC ) 5 MG tablet, TAKE 1 TABLET(5 MG) BY MOUTH DAILY, Disp: 90 tablet, Rfl: 2   fluticasone  (FLONASE ) 50 MCG/ACT nasal spray, Place 2 sprays into both nostrils daily., Disp: 16 g, Rfl: 0   hydrochlorothiazide  (HYDRODIURIL ) 25 MG tablet, Take 1 tablet (25 mg total) by mouth daily., Disp: 90 tablet, Rfl: 2   levothyroxine (SYNTHROID) 200 MCG tablet, Take 200 mcg by mouth daily before breakfast., Disp: , Rfl:    montelukast  (SINGULAIR ) 10 MG tablet, TAKE 1 TABLET(10 MG) BY MOUTH AT BEDTIME, Disp: 90 tablet, Rfl: 2   ondansetron  (ZOFRAN -ODT) 4 MG disintegrating tablet, Take 4 mg by mouth every 6 (six) hours as needed., Disp: , Rfl:    Rimegepant Sulfate (NURTEC) 75 MG TBDP, Take 1 tablet (75 mg total) by mouth as needed (Migraine)., Disp: 10 tablet, Rfl: 5   sertraline  (ZOLOFT ) 50 MG tablet, Take 1 tablet (50 mg total) by mouth daily., Disp: 90 tablet, Rfl: 2  Observations/Objective: Patient is well-developed, well-nourished in no acute distress.  Resting comfortably at home.  Head is normocephalic, atraumatic.  No labored breathing.  Speech is clear and coherent with logical content.  Patient is alert and oriented at baseline.    Assessment and Plan: 1. Menorrhagia with irregular cycle (Primary) - ibuprofen  (ADVIL ) 800 MG tablet; Take 1 tablet (800 mg total) by mouth every 8 (eight) hours as needed.  Dispense: 60 tablet; Refill: 0   Follow Up Instructions: I discussed the assessment and treatment plan with the patient. The patient was provided an opportunity to ask questions and all were answered. The patient agreed with the plan and demonstrated an understanding of the instructions.  A copy of instructions were sent to the patient via MyChart unless otherwise noted below.   The patient was advised to call back or  seek an in-person evaluation if the symptoms worsen or if the condition fails to improve as anticipated.    Calder Oblinger W Azalea Cedar, NP

## 2023-09-20 ENCOUNTER — Telehealth: Admitting: Physician Assistant

## 2023-09-20 DIAGNOSIS — R42 Dizziness and giddiness: Secondary | ICD-10-CM

## 2023-09-20 NOTE — Patient Instructions (Signed)
  Anne Shaffer, thank you for joining Anne Shuck, PA-C for today's virtual visit.  While this provider is not your primary care provider (PCP), if your PCP is located in our provider database this encounter information will be shared with them immediately following your visit.   A [facility] MyChart account gives you access to today's visit and all your visits, tests, and labs performed at [facility]  click here if you don't have a [facility] MyChart account or go to [email or web address]  Consent: (Patient) Anne Shaffer provided verbal consent for this virtual visit at the beginning of the encounter.  Current Medications:  Current Outpatient Medications:    albuterol  (PROVENTIL ) (2.5 MG/3ML) 0.083% nebulizer solution, Take 3 mLs (2.5 mg total) by nebulization every 6 (six) hours as needed for wheezing or shortness of breath., Disp: 150 mL, Rfl: 1   albuterol  (VENTOLIN  HFA) 108 (90 Base) MCG/ACT inhaler, Inhale 2 puffs into the lungs every 6 (six) hours as needed for wheezing or shortness of breath., Disp: 8 g, Rfl: 0   amLODipine  (NORVASC ) 5 MG tablet, TAKE 1 TABLET(5 MG) BY MOUTH DAILY, Disp: 90 tablet, Rfl: 2   fluticasone  (FLONASE ) 50 MCG/ACT nasal spray, Place 2 sprays into both nostrils daily., Disp: 16 g, Rfl: 0   hydrochlorothiazide  (HYDRODIURIL ) 25 MG tablet, Take 1 tablet (25 mg total) by mouth daily., Disp: 90 tablet, Rfl: 2   ibuprofen  (ADVIL ) 800 MG tablet, Take 1 tablet (800 mg total) by mouth every 8 (eight) hours as needed., Disp: 60 tablet, Rfl: 0   levothyroxine (SYNTHROID) 200 MCG tablet, Take 200 mcg by mouth daily before breakfast., Disp: , Rfl:    montelukast  (SINGULAIR ) 10 MG tablet, TAKE 1 TABLET(10 MG) BY MOUTH AT BEDTIME, Disp: 90 tablet, Rfl: 2   ondansetron  (ZOFRAN -ODT) 4 MG disintegrating tablet, Take 4 mg by mouth every 6 (six) hours as needed., Disp: , Rfl:    Rimegepant Sulfate (NURTEC) 75 MG TBDP, Take 1 tablet (75 mg total) by mouth as  needed (Migraine)., Disp: 10 tablet, Rfl: 5   sertraline  (ZOLOFT ) 50 MG tablet, Take 1 tablet (50 mg total) by mouth daily., Disp: 90 tablet, Rfl: 2   Medications ordered in this encounter:  No orders of the defined types were placed in this encounter.    *If you need refills on other medications prior to your next appointment, please contact your pharmacy*  Follow-Up: Call back or seek an in-person evaluation if the symptoms worsen or if the condition fails to improve as anticipated.  [facility] [phone]  Other Instructions Report to nearest ER.   If you have been instructed to have an in-person evaluation today at a local Urgent Care facility, please use the link below. It will take you to a list of all of our available [facility], including address, phone number and hours of operation. Please do not delay care.  [facility]  If you or a family member do not have a primary care provider, use the link below to schedule a visit and establish care. When you choose a [facility] primary care physician or advanced practice provider, you gain a long-term partner in health. Find a Primary Care Provider  Learn more about [facility]'s in-office and virtual care options: [facility] - Get Care Now

## 2023-09-20 NOTE — Progress Notes (Signed)
 Virtual Visit Consent   Anne Shaffer, you are scheduled for a virtual visit with a  provider today. Just as with appointments in the office, your consent must be obtained to participate. Your consent will be active for this visit and any virtual visit you may have with one of our providers in the next 365 days. If you have a MyChart account, a copy of this consent can be sent to you electronically.  As this is a virtual visit, video technology does not allow for your provider to perform a traditional examination. This may limit your provider's ability to fully assess your condition. If your provider identifies any concerns that need to be evaluated in person or the need to arrange testing (such as labs, EKG, etc.), we will make arrangements to do so. Although advances in technology are sophisticated, we cannot ensure that it will always work on either your end or our end. If the connection with a video visit is poor, the visit may have to be switched to a telephone visit. With either a video or telephone visit, we are not always able to ensure that we have a secure connection.  By engaging in this virtual visit, you consent to the provision of healthcare and authorize for your insurance to be billed (if applicable) for the services provided during this visit. Depending on your insurance coverage, you may receive a charge related to this service.  I need to obtain your verbal consent now. Are you willing to proceed with your visit today? Lessly Stigler Falzon has provided verbal consent on 09/20/2023 for a virtual visit (video or telephone). Teena Shuck, [place]  Date: 09/20/2023 4:59 PM   Virtual Visit via Video Note   I, Teena Shuck, connected with  Anne Shaffer  ([id number], [birth year]) on 09/20/23 at  5:00 PM EDT by a video-enabled telemedicine application and verified that I am speaking with the correct person using two identifiers.  Location: Patient: Virtual Visit Location Patient:  Home Provider: Virtual Visit Location Provider: Home Office   I discussed the limitations of evaluation and management by telemedicine and the availability of in person appointments. The patient expressed understanding and agreed to proceed.    History of Present Illness: Anne Shaffer is a 38 y.o. who identifies as a female who was assigned female at birth, and is being seen today for dizziness and fatigue.  HPI: Dizziness The current episode started in the past 7 days. The problem occurs constantly. The problem has been gradually worsening. Associated symptoms include fatigue. Nothing aggravates the symptoms. She has tried nothing for the symptoms. The treatment provided mild relief.    Problems:  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   Migraine without aura and without status migrainosus, not intractable 11/01/2020   Encounter to establish care 05/31/2020   Moderate persistent asthma 05/31/2020   Post-COVID syndrome 05/31/2020   Essential hypertension    Anxiety 03/18/2018   Class 3 severe obesity due to excess calories without serious comorbidity with body mass index (BMI) of 50.0 to 59.9 in adult 03/18/2018   THYROID  NODULE 12/03/2008    Allergies:  Allergies  Allergen Reactions   Penicillins Itching   Shellfish Allergy    Tomato Anxiety, Hives, Itching and Swelling   Shellfish Allergy    Medications:  Current Outpatient Medications:    albuterol  (PROVENTIL ) (2.5 MG/3ML) 0.083% nebulizer solution, Take 3 mLs (2.5 mg total) by nebulization every 6 (six) hours as needed for wheezing or shortness of breath., Disp: 150 mL, Rfl: 1   albuterol  (VENTOLIN  HFA) 108 (90 Base) MCG/ACT inhaler, Inhale 2 puffs into the lungs every 6 (six) hours as needed for wheezing or shortness of  breath., Disp: 8 g, Rfl: 0   amLODipine  (NORVASC ) 5 MG tablet, TAKE 1 TABLET(5 MG) BY MOUTH DAILY, Disp: 90 tablet, Rfl: 2   fluticasone  (FLONASE ) 50 MCG/ACT nasal spray, Place 2 sprays into both nostrils daily., Disp: 16 g, Rfl: 0   hydrochlorothiazide  (HYDRODIURIL ) 25 MG tablet, Take 1 tablet (25 mg total) by mouth daily., Disp: 90 tablet, Rfl: 2   ibuprofen  (ADVIL ) 800 MG tablet, Take 1 tablet (800 mg total) by mouth every 8 (eight) hours as needed., Disp: 60 tablet, Rfl: 0   levothyroxine (SYNTHROID) 200 MCG tablet, Take 200 mcg by mouth daily before breakfast., Disp: , Rfl:    montelukast  (SINGULAIR ) 10 MG tablet, TAKE 1 TABLET(10 MG) BY MOUTH AT BEDTIME, Disp: 90 tablet, Rfl: 2   ondansetron  (ZOFRAN -ODT) 4 MG disintegrating tablet, Take 4 mg by mouth every 6 (six) hours as needed., Disp: , Rfl:    Rimegepant Sulfate (NURTEC) 75 MG TBDP, Take 1 tablet (75 mg total) by mouth as needed (Migraine)., Disp: 10 tablet, Rfl: 5   sertraline  (ZOLOFT ) 50 MG tablet, Take 1 tablet (50 mg total) by mouth daily., Disp: 90 tablet, Rfl: 2  Observations/Objective: Patient is well-developed, well-nourished in no acute distress.  Resting comfortably  at home.  Head is normocephalic, atraumatic.  No labored breathing.  Speech is clear and coherent with logical content.  Patient is alert and oriented at baseline.    Assessment and Plan: 1. Dizziness (Primary)  Worsening dizziness x 1 week after menstruation. States cycle was abnormally heavy. Due this history and the patients symptoms I advised her to be seen in person as she would benefit from diagnostic and lab studies. She verbalized understanding and agreement with this plan.  Follow Up Instructions: I discussed the assessment and treatment plan with the patient. The patient was provided an opportunity to ask questions and all were answered. The patient agreed with the plan and demonstrated an understanding of the instructions.  A copy of instructions  were sent to the patient via MyChart unless otherwise noted below.     The patient was advised to call back or seek an in-person evaluation if the symptoms worsen or if the condition fails to improve as anticipated.    Teena Shuck, PA-C

## 2023-10-18 DIAGNOSIS — E66813 Obesity, class 3: Secondary | ICD-10-CM | POA: Diagnosis not present

## 2023-10-18 DIAGNOSIS — Z6841 Body Mass Index (BMI) 40.0 and over, adult: Secondary | ICD-10-CM | POA: Diagnosis not present

## 2023-10-18 DIAGNOSIS — E890 Postprocedural hypothyroidism: Secondary | ICD-10-CM | POA: Diagnosis not present

## 2023-10-18 DIAGNOSIS — E049 Nontoxic goiter, unspecified: Secondary | ICD-10-CM | POA: Diagnosis not present

## 2023-10-18 DIAGNOSIS — C73 Malignant neoplasm of thyroid gland: Secondary | ICD-10-CM | POA: Diagnosis not present

## 2023-10-22 ENCOUNTER — Telehealth: Payer: Self-pay | Admitting: Family Medicine

## 2023-10-22 NOTE — Telephone Encounter (Signed)
 Called patient in regards to referral left detailed message and office number for patient to call back if interested in an appointment.

## 2023-10-31 ENCOUNTER — Telehealth: Admitting: Family

## 2023-10-31 DIAGNOSIS — R399 Unspecified symptoms and signs involving the genitourinary system: Secondary | ICD-10-CM

## 2023-10-31 DIAGNOSIS — R109 Unspecified abdominal pain: Secondary | ICD-10-CM

## 2023-10-31 DIAGNOSIS — R509 Fever, unspecified: Secondary | ICD-10-CM

## 2023-10-31 NOTE — Patient Instructions (Signed)
Pyelonephritis, Adult Pyelonephritis is an infection that occurs in the kidney. The kidneys are the organs that filter the blood and move waste from the bloodstream to the urine. Urine passes out of the kidneys through tubes called ureters and goes into the bladder. There are two main types of this condition: Acute pyelonephritis. These are infections that come on quickly and without any warning. Chronic pyelonephritis. These infections last for a long time. In most cases, the infection clears up with treatment. In more severe cases, an infection can spread to the bloodstream or lead to other problems with the kidneys. What are the causes? This condition is often caused by bacteria. The bacteria may travel: From the bladder up to the kidney. This may happen after you have a bladder infection (cystitis) or urinary tract infection (UTI). From the bloodstream to the kidney. What increases the risk? You are more likely to develop this condition if: You are female. Your risk is even higher if you are pregnant. You are older. You have: Diabetes. Prostatitis. This is inflammation of the prostate gland. Kidney stones or bladder stones. Problems with your kidneys or ureters. Cancer. Spinal cord injury or nerve damage around the bladder. You have a soft tube (catheter) placed in your bladder. You are sexually active and use spermicides. You have or have had a UTI. What are the signs or symptoms? Symptoms of this condition include: An urge to urinate that is strong or does not go away. You may also urinate more often than normal. A burning or stinging feeling when you urinate. Pain. This may be in your abdomen, back, side, or groin. Fever or chills. Nausea or vomiting. Urine that is bloody, dark, cloudy, or smells bad. How is this diagnosed? This condition may be diagnosed based on your medical history and a physical exam. You may also have tests, such as: Urine tests. Blood tests. Imaging  tests of the kidneys. These may include an ultrasound or CT scan. How is this treated? Treatment for this condition depends on the severity of the infection. If the infection is mild and found early, you may be given antibiotics to take by mouth. You will need to drink lots of fluids. If the infection is more severe, you may need to stay in the hospital and receive antibiotics through an IV. You may also get fluids through an IV. After you leave the hospital, you may need to take antibiotics by mouth. Other treatments may be needed. These will depend on the cause of the infection. Follow these instructions at home: Eating and drinking Drink enough fluid to keep your urine pale yellow. Avoid caffeine, tea, and carbonated drinks. These can irritate the bladder. General instructions Take over-the-counter and prescription medicines as told by your health care provider. Finish your antibiotics even if you start to feel better. Urinate often. Avoid holding in urine for long periods of time. Urinate before and after sex. If you are female, cleanse from front to back after a bowel movement. Use each tissue only once. Keep all follow-up visits. Your health care provider will want to make sure your infection is gone. Contact a health care provider if: Your symptoms do not get better after 2 days of treatment. Your symptoms get worse. You have a fever or chills. You cannot take your antibiotics. Get help right away if: You vomit each time that you eat or drink. You have severe pain in your back or side. You are very weak, or you faint. This information is  not intended to replace advice given to you by your health care provider. Make sure you discuss any questions you have with your health care provider. Document Revised: 08/11/2021 Document Reviewed: 08/11/2021 Elsevier Patient Education  2024 [employer].

## 2023-10-31 NOTE — Progress Notes (Signed)
 Virtual Visit Consent   Anne Shaffer, you are scheduled for a virtual visit with a [facility] provider today. Just as with appointments in the office, your consent must be obtained to participate. Your consent will be active for this visit and any virtual visit you may have with one of our providers in the next 365 days. If you have a MyChart account, a copy of this consent can be sent to you electronically.  As this is a virtual visit, video technology does not allow for your provider to perform a traditional examination. This may limit your provider's ability to fully assess your condition. If your provider identifies any concerns that need to be evaluated in person or the need to arrange testing (such as labs, EKG, etc.), we will make arrangements to do so. Although advances in technology are sophisticated, we cannot ensure that it will always work on either your end or our end. If the connection with a video visit is poor, the visit may have to be switched to a telephone visit. With either a video or telephone visit, we are not always able to ensure that we have a secure connection.  By engaging in this virtual visit, you consent to the provision of healthcare and authorize for your insurance to be billed (if applicable) for the services provided during this visit. Depending on your insurance coverage, you may receive a charge related to this service.  I need to obtain your verbal consent now. Are you willing to proceed with your visit today? Anne Shaffer has provided verbal consent on 10/31/2023 for a virtual visit (video or telephone). Bari Learn, FNP  Date: 10/31/2023 4:24 PM   Virtual Visit via Video Note   I, Bari Learn, connected with  Anne Shaffer  ([id number], 06-19-1985) on 10/31/23 at  4:30 PM EDT by a video-enabled telemedicine application and verified that I am speaking with the correct person using two identifiers.  Location: Patient: Virtual Visit Location Patient:  Home Provider: Virtual Visit Location Provider: Home Office   I discussed the limitations of evaluation and management by telemedicine and the availability of in person appointments. The patient expressed understanding and agreed to proceed.    History of Present Illness: Anne Shaffer is a 38 y.o. who identifies as a female who was assigned female at birth, and is being seen today for UTI symptoms that started yesterday.  HPI: Dysuria  This is a new problem. The current episode started yesterday. The problem occurs intermittently. The problem has been waxing and waning. The quality of the pain is described as burning. The pain is at a severity of 7/10. The pain is moderate. The maximum temperature recorded prior to her arrival was 100 - 100.9 F. Associated symptoms include chills, flank pain (right), frequency, nausea and urgency. Pertinent negatives include no hematuria or vomiting. She has tried increased fluids for the symptoms. The treatment provided mild relief.    Problems:  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   Migraine without aura and without status migrainosus, not intractable 11/01/2020   Encounter to establish care 05/31/2020   Moderate persistent asthma 05/31/2020   Post-COVID syndrome 05/31/2020   Essential hypertension    Anxiety 03/18/2018   Class 3 severe obesity due to excess calories without serious comorbidity with  body mass index (BMI) of 50.0 to 59.9 in adult 03/18/2018   THYROID  NODULE 12/03/2008    Allergies:  Allergies  Allergen Reactions   Penicillins Itching   Shellfish Allergy    Tomato Anxiety, Hives, Itching and Swelling   Shellfish Allergy    Medications:  Current Outpatient Medications:    albuterol  (PROVENTIL ) (2.5 MG/3ML) 0.083% nebulizer  solution, Take 3 mLs (2.5 mg total) by nebulization every 6 (six) hours as needed for wheezing or shortness of breath., Disp: 150 mL, Rfl: 1   albuterol  (VENTOLIN  HFA) 108 (90 Base) MCG/ACT inhaler, Inhale 2 puffs into the lungs every 6 (six) hours as needed for wheezing or shortness of breath., Disp: 8 g, Rfl: 0   amLODipine  (NORVASC ) 5 MG tablet, TAKE 1 TABLET(5 MG) BY MOUTH DAILY, Disp: 90 tablet, Rfl: 2   fluticasone  (FLONASE ) 50 MCG/ACT nasal spray, Place 2 sprays into both nostrils daily., Disp: 16 g, Rfl: 0   hydrochlorothiazide  (HYDRODIURIL ) 25 MG tablet, Take 1 tablet (25 mg total) by mouth daily., Disp: 90 tablet, Rfl: 2   ibuprofen  (ADVIL ) 800 MG tablet, Take 1 tablet (800 mg total) by mouth every 8 (eight) hours as needed., Disp: 60 tablet, Rfl: 0   levothyroxine (SYNTHROID) 200 MCG tablet, Take 200 mcg by mouth daily before breakfast., Disp: , Rfl:    montelukast  (SINGULAIR ) 10 MG tablet, TAKE 1 TABLET(10 MG) BY MOUTH AT BEDTIME, Disp: 90 tablet, Rfl: 2   ondansetron  (ZOFRAN -ODT) 4 MG disintegrating tablet, Take 4 mg by mouth every 6 (six) hours as needed., Disp: , Rfl:    Rimegepant Sulfate (NURTEC) 75 MG TBDP, Take 1 tablet (75 mg total) by mouth as needed (Migraine)., Disp: 10 tablet, Rfl: 5   sertraline  (ZOLOFT ) 50 MG tablet, Take 1 tablet (50 mg total) by mouth daily., Disp: 90 tablet, Rfl: 2  Observations/Objective: Patient is well-developed, well-nourished in no acute distress.  Resting comfortably  at home.  Head is normocephalic, atraumatic.  No labored breathing.  Speech is clear and coherent with logical content.  Patient is alert and oriented at baseline.    Assessment and Plan: 1. UTI symptoms (Primary)  2. Flank pain  3. Fever, unspecified fever cause  Given UTI symptoms, fever, and flank pain pt advised to be seen today in person to rule out pyelonephritis.  Force fluids   Follow Up Instructions: I discussed the assessment and treatment plan with the  patient. The patient was provided an opportunity to ask questions and all were answered. The patient agreed with the plan and demonstrated an understanding of the instructions.  A copy of instructions were sent to the patient via MyChart unless otherwise noted below.    The patient was advised to call back or seek an in-person evaluation if the symptoms worsen or if the condition fails to improve as anticipated.    Bari Learn, FNP

## 2023-11-02 ENCOUNTER — Telehealth: Admitting: Physician Assistant

## 2023-11-02 DIAGNOSIS — R3989 Other symptoms and signs involving the genitourinary system: Secondary | ICD-10-CM | POA: Diagnosis not present

## 2023-11-02 MED ORDER — SULFAMETHOXAZOLE-TRIMETHOPRIM 800-160 MG PO TABS: 1.0000 | ORAL_TABLET | Freq: Two times a day (BID) | ORAL | 0 refills | Status: DC

## 2023-11-02 NOTE — Progress Notes (Signed)
 Virtual Visit Consent   Anne Shaffer, you are scheduled for a virtual visit with a [facility] provider today. Just as with appointments in the office, your consent must be obtained to participate. Your consent will be active for this visit and any virtual visit you may have with one of our providers in the next 365 days. If you have a MyChart account, a copy of this consent can be sent to you electronically.  As this is a virtual visit, video technology does not allow for your provider to perform a traditional examination. This may limit your provider's ability to fully assess your condition. If your provider identifies any concerns that need to be evaluated in person or the need to arrange testing (such as labs, EKG, etc.), we will make arrangements to do so. Although advances in technology are sophisticated, we cannot ensure that it will always work on either your end or our end. If the connection with a video visit is poor, the visit may have to be switched to a telephone visit. With either a video or telephone visit, we are not always able to ensure that we have a secure connection.  By engaging in this virtual visit, you consent to the provision of healthcare and authorize for your insurance to be billed (if applicable) for the services provided during this visit. Depending on your insurance coverage, you may receive a charge related to this service.  I need to obtain your verbal consent now. Are you willing to proceed with your visit today? Anne Shaffer has provided verbal consent on 11/02/2023 for a virtual visit (video or telephone). Anne Shaffer, [place]  Date: 11/02/2023 10:33 AM   Virtual Visit via Video Note   I, Anne Shaffer, connected with  Anne Shaffer  ([id number], [birth year]) on 11/02/23 at 10:15 AM EDT by a video-enabled telemedicine application and verified that I am speaking with the correct person using two identifiers.  Location: Patient: Virtual Visit Location  Patient: Home Provider: Virtual Visit Location Provider: Home Office   I discussed the limitations of evaluation and management by telemedicine and the availability of in person appointments. The patient expressed understanding and agreed to proceed.    History of Present Illness: Anne Shaffer is a 38 y.o. who identifies as a female who was assigned female at birth, and is being seen today for continued UTI symptoms. Was evaluated 2 days ago via video visit, and was instructed to be evaluated in person due to low-grade fever. Patient was not seen in person. Notes she feels that her symptoms were being exaggerated by provider and so she has been treating at home with OTC AZO. Notes her initial back pain stopped as soon as her period was over. Denies any recurrence of fever. Denies nausea or vomiting. Notes just mild dysuria (alleviated with AZO), urgency and frequency.     HPI: HPI  Problems:  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   Migraine without aura and without status migrainosus, not intractable 11/01/2020   Encounter to establish care 05/31/2020   Moderate persistent asthma 05/31/2020   Post-COVID syndrome 05/31/2020   Essential hypertension    Anxiety 03/18/2018   Class 3 severe obesity due to excess calories without serious comorbidity with body mass index (BMI) of 50.0 to  59.9 in adult 03/18/2018   THYROID  NODULE 12/03/2008    Allergies:  Allergies  Allergen Reactions   Penicillins Itching   Shellfish Allergy    Tomato Anxiety, Hives, Itching and Swelling   Shellfish Allergy    Medications:  Current Outpatient Medications:    sulfamethoxazole-trimethoprim (BACTRIM DS) 800-160 MG tablet, Take 1 tablet by mouth 2 (two) times daily., Disp: 14 tablet, Rfl: 0   albuterol   (PROVENTIL ) (2.5 MG/3ML) 0.083% nebulizer solution, Take 3 mLs (2.5 mg total) by nebulization every 6 (six) hours as needed for wheezing or shortness of breath., Disp: 150 mL, Rfl: 1   albuterol  (VENTOLIN  HFA) 108 (90 Base) MCG/ACT inhaler, Inhale 2 puffs into the lungs every 6 (six) hours as needed for wheezing or shortness of breath., Disp: 8 g, Rfl: 0   amLODipine  (NORVASC ) 5 MG tablet, TAKE 1 TABLET(5 MG) BY MOUTH DAILY, Disp: 90 tablet, Rfl: 2   fluticasone  (FLONASE ) 50 MCG/ACT nasal spray, Place 2 sprays into both nostrils daily., Disp: 16 g, Rfl: 0   hydrochlorothiazide  (HYDRODIURIL ) 25 MG tablet, Take 1 tablet (25 mg total) by mouth daily., Disp: 90 tablet, Rfl: 2   ibuprofen  (ADVIL ) 800 MG tablet, Take 1 tablet (800 mg total) by mouth every 8 (eight) hours as needed., Disp: 60 tablet, Rfl: 0   levothyroxine (SYNTHROID) 200 MCG tablet, Take 200 mcg by mouth daily before breakfast., Disp: , Rfl:    montelukast  (SINGULAIR ) 10 MG tablet, TAKE 1 TABLET(10 MG) BY MOUTH AT BEDTIME, Disp: 90 tablet, Rfl: 2   ondansetron  (ZOFRAN -ODT) 4 MG disintegrating tablet, Take 4 mg by mouth every 6 (six) hours as needed., Disp: , Rfl:    Rimegepant Sulfate (NURTEC) 75 MG TBDP, Take 1 tablet (75 mg total) by mouth as needed (Migraine)., Disp: 10 tablet, Rfl: 5   sertraline  (ZOLOFT ) 50 MG tablet, Take 1 tablet (50 mg total) by mouth daily., Disp: 90 tablet, Rfl: 2  Observations/Objective: Patient is well-developed, well-nourished in no acute distress.  Resting comfortably at home.  Head is normocephalic, atraumatic.  No labored breathing.  Speech is clear and coherent with logical content.  Patient is alert and oriented at baseline.   Assessment and Plan: 1. Suspected UTI (Primary) - sulfamethoxazole-trimethoprim (BACTRIM DS) 800-160 MG tablet; Take 1 tablet by mouth 2 (two) times daily.  Dispense: 14 tablet; Refill: 0  Classic UTI symptoms with absence of alarm signs or symptoms -- denies flank pain,  fever, n/v. Prior history of UTI. Will treat empirically with bactrim for suspected uncomplicated cystitis. Supportive measures and OTC medications reviewed. Strict in-person evaluation precautions discussed.    Follow Up Instructions: I discussed the assessment and treatment plan with the patient. The patient was provided an opportunity to ask questions and all were answered. The patient agreed with the plan and demonstrated an understanding of the instructions.  A copy of instructions were sent to the patient via MyChart unless otherwise noted below.   The patient was advised to call back or seek an in-person evaluation if the symptoms worsen or if the condition fails to improve as anticipated.    Anne Velma Lunger, PA-C

## 2023-11-02 NOTE — Patient Instructions (Signed)
 Anne Shaffer, thank you for joining Anne Velma Lunger, PA-C for today's virtual visit.  While this provider is not your primary care provider (PCP), if your PCP is located in our provider database this encounter information will be shared with them immediately following your visit.   A [facility] MyChart account gives you access to today's visit and all your visits, tests, and labs performed at [facility]  click here if you don't have a [facility] MyChart account or go to [email or web address]  Consent: (Patient) Anne Shaffer provided verbal consent for this virtual visit at the beginning of the encounter.  Current Medications:  Current Outpatient Medications:    sulfamethoxazole-trimethoprim (BACTRIM DS) 800-160 MG tablet, Take 1 tablet by mouth 2 (two) times daily., Disp: 14 tablet, Rfl: 0   albuterol  (PROVENTIL ) (2.5 MG/3ML) 0.083% nebulizer solution, Take 3 mLs (2.5 mg total) by nebulization every 6 (six) hours as needed for wheezing or shortness of breath., Disp: 150 mL, Rfl: 1   albuterol  (VENTOLIN  HFA) 108 (90 Base) MCG/ACT inhaler, Inhale 2 puffs into the lungs every 6 (six) hours as needed for wheezing or shortness of breath., Disp: 8 g, Rfl: 0   amLODipine  (NORVASC ) 5 MG tablet, TAKE 1 TABLET(5 MG) BY MOUTH DAILY, Disp: 90 tablet, Rfl: 2   fluticasone  (FLONASE ) 50 MCG/ACT nasal spray, Place 2 sprays into both nostrils daily., Disp: 16 g, Rfl: 0   hydrochlorothiazide  (HYDRODIURIL ) 25 MG tablet, Take 1 tablet (25 mg total) by mouth daily., Disp: 90 tablet, Rfl: 2   ibuprofen  (ADVIL ) 800 MG tablet, Take 1 tablet (800 mg total) by mouth every 8 (eight) hours as needed., Disp: 60 tablet, Rfl: 0   levothyroxine (SYNTHROID) 200 MCG tablet, Take 200 mcg by mouth daily before breakfast., Disp: , Rfl:    montelukast  (SINGULAIR ) 10 MG tablet, TAKE 1 TABLET(10 MG) BY MOUTH AT BEDTIME, Disp: 90 tablet, Rfl: 2   ondansetron  (ZOFRAN -ODT) 4 MG disintegrating tablet, Take 4 mg  by mouth every 6 (six) hours as needed., Disp: , Rfl:    Rimegepant Sulfate (NURTEC) 75 MG TBDP, Take 1 tablet (75 mg total) by mouth as needed (Migraine)., Disp: 10 tablet, Rfl: 5   sertraline  (ZOLOFT ) 50 MG tablet, Take 1 tablet (50 mg total) by mouth daily., Disp: 90 tablet, Rfl: 2   Medications ordered in this encounter:  Meds ordered this encounter  Medications   sulfamethoxazole-trimethoprim (BACTRIM DS) 800-160 MG tablet    Sig: Take 1 tablet by mouth 2 (two) times daily.    Dispense:  14 tablet    Refill:  0    Supervising Provider:   BLAISE ALEENE KIDD [[id number]]     *If you need refills on other medications prior to your next appointment, please contact your pharmacy*  Follow-Up: Call back or seek an in-person evaluation if the symptoms worsen or if the condition fails to improve as anticipated.  [facility] [phone]  Other Instructions Your symptoms are consistent with a bladder infection, also called acute cystitis. Please take your antibiotic (Bactrim) as directed until all pills are gone.  Stay very well hydrated.  Consider a daily probiotic (Align, Culturelle, or Activia) to help prevent stomach upset caused by the antibiotic.  Taking a probiotic daily may also help prevent recurrent UTIs.  Also consider taking AZO (Phenazopyridine) tablets to help decrease pain with urination.    If you note any non-resolving, new, or worsening symptoms despite treatment, please seek an in-person evaluation ASAP.  Urinary Tract Infection A urinary tract infection (UTI) can occur any place along the urinary tract. The tract includes the kidneys, ureters, bladder, and urethra. A type of germ called bacteria often causes a UTI. UTIs are often helped with antibiotic medicine.  HOME CARE  If given, take antibiotics as told by your doctor. Finish them even if you start to feel better. Drink enough fluids to keep your pee (urine) clear or pale yellow. Avoid tea, drinks with  caffeine, and bubbly (carbonated) drinks. Pee often. Avoid holding your pee in for a long time. Pee before and after having sex (intercourse). Wipe from front to back after you poop (bowel movement) if you are a woman. Use each tissue only once. GET HELP RIGHT AWAY IF:  You have back pain. You have lower belly (abdominal) pain. You have chills. You feel sick to your stomach (nauseous). You throw up (vomit). Your burning or discomfort with peeing does not go away. You have a fever. Your symptoms are not better in 3 days. MAKE SURE YOU:  Understand these instructions. Will watch your condition. Will get help right away if you are not doing well or get worse. Document Released: 07/08/2007 Document Revised: 10/14/2011 Document Reviewed: 08/20/2011 [facility] Patient Information 2015 [place], Anne Shaffer. This information is not intended to replace advice given to you by your health care provider. Make sure you discuss any questions you have with your health care provider.    If you have been instructed to have an in-person evaluation today at a local Urgent Care facility, please use the link below. It will take you to a list of all of our available [facility], including address, phone number and hours of operation. Please do not delay care.  [facility]  If you or a family member do not have a primary care provider, use the link below to schedule a visit and establish care. When you choose a [facility] primary care physician or advanced practice provider, you gain a long-term partner in health. Find a Primary Care Provider  Learn more about [facility]'s in-office and virtual care options: [facility] - Get Care Now

## 2023-11-04 ENCOUNTER — Encounter (HOSPITAL_BASED_OUTPATIENT_CLINIC_OR_DEPARTMENT_OTHER): Admitting: Family Medicine

## 2023-12-08 ENCOUNTER — Telehealth

## 2023-12-08 ENCOUNTER — Telehealth: Admitting: Physician Assistant

## 2023-12-08 DIAGNOSIS — J4541 Moderate persistent asthma with (acute) exacerbation: Secondary | ICD-10-CM | POA: Diagnosis not present

## 2023-12-08 MED ORDER — PROMETHAZINE-DM 6.25-15 MG/5ML PO SYRP: 5.0000 mL | ORAL_SOLUTION | Freq: Four times a day (QID) | ORAL | 0 refills | Status: DC | PRN

## 2023-12-08 MED ORDER — PREDNISONE 20 MG PO TABS: 40.0000 mg | ORAL_TABLET | Freq: Every day | ORAL | 0 refills | Status: DC

## 2023-12-08 NOTE — Progress Notes (Signed)
 Virtual Visit Consent   Tinea Nobile Soulliere, you are scheduled for a virtual visit with a [facility] provider today. Just as with appointments in the office, your consent must be obtained to participate. Your consent will be active for this visit and any virtual visit you may have with one of our providers in the next 365 days. If you have a MyChart account, a copy of this consent can be sent to you electronically.  As this is a virtual visit, video technology does not allow for your provider to perform a traditional examination. This may limit your provider's ability to fully assess your condition. If your provider identifies any concerns that need to be evaluated in person or the need to arrange testing (such as labs, EKG, etc.), we will make arrangements to do so. Although advances in technology are sophisticated, we cannot ensure that it will always work on either your end or our end. If the connection with a video visit is poor, the visit may have to be switched to a telephone visit. With either a video or telephone visit, we are not always able to ensure that we have a secure connection.  By engaging in this virtual visit, you consent to the provision of healthcare and authorize for your insurance to be billed (if applicable) for the services provided during this visit. Depending on your insurance coverage, you may receive a charge related to this service.  I need to obtain your verbal consent now. Are you willing to proceed with your visit today? Anne Shaffer has provided verbal consent on 12/08/2023 for a virtual visit (video or telephone). Anne Shaffer, [place]  Date: 12/08/2023 5:28 PM   Virtual Visit via Video Note   I, Anne Shaffer, connected with  Anne Shaffer  ([id number], 1985/10/15) on 12/08/23 at  5:15 PM EST by a video-enabled telemedicine application and verified that I am speaking with the correct person using two identifiers.  Location: Patient: Virtual Visit Location  Patient: Home Provider: Virtual Visit Location Provider: Home Office   I discussed the limitations of evaluation and management by telemedicine and the availability of in person appointments. The patient expressed understanding and agreed to proceed.    History of Present Illness: Anne Shaffer is a 38 y.o. who identifies as a female who was assigned female at birth, and is being seen today for asthma exacerbation.  Endorses some mild increase in cough since Sunday, with substantial chest tightness and wheezing starting yesterday.  Has had to use her albuterol  inhaler twice yesterday, and her nebulizer last night.  Denies fever or chills.  Notes cough is persistent but dry.    HPI: HPI  Problems:  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   Migraine without aura and without status migrainosus, not intractable 11/01/2020   Encounter to establish care 05/31/2020   Moderate persistent asthma 05/31/2020   Post-COVID syndrome 05/31/2020   Essential hypertension    Anxiety 03/18/2018   Class 3 severe obesity due to excess calories without serious comorbidity with body mass index (BMI) of 50.0 to 59.9 in adult (HCC) 03/18/2018   THYROID  NODULE 12/03/2008    Allergies:  Allergies  Allergen Reactions   Penicillins Itching   Shellfish Allergy    Tomato Anxiety, Hives, Itching and Swelling  Shellfish Allergy    Medications:  Current Outpatient Medications:    predniSONE  (DELTASONE ) 20 MG tablet, Take 2 tablets (40 mg total) by mouth daily with breakfast., Disp: 10 tablet, Rfl: 0   promethazine -dextromethorphan (PROMETHAZINE -DM) 6.25-15 MG/5ML syrup, Take 5 mLs by mouth 4 (four) times daily as needed for cough., Disp: 118 mL, Rfl: 0   albuterol  (PROVENTIL ) (2.5 MG/3ML) 0.083% nebulizer solution,  Take 3 mLs (2.5 mg total) by nebulization every 6 (six) hours as needed for wheezing or shortness of breath., Disp: 150 mL, Rfl: 1   albuterol  (VENTOLIN  HFA) 108 (90 Base) MCG/ACT inhaler, Inhale 2 puffs into the lungs every 6 (six) hours as needed for wheezing or shortness of breath., Disp: 8 g, Rfl: 0   amLODipine  (NORVASC ) 5 MG tablet, TAKE 1 TABLET(5 MG) BY MOUTH DAILY, Disp: 90 tablet, Rfl: 2   fluticasone  (FLONASE ) 50 MCG/ACT nasal spray, Place 2 sprays into both nostrils daily., Disp: 16 g, Rfl: 0   hydrochlorothiazide  (HYDRODIURIL ) 25 MG tablet, Take 1 tablet (25 mg total) by mouth daily., Disp: 90 tablet, Rfl: 2   ibuprofen  (ADVIL ) 800 MG tablet, Take 1 tablet (800 mg total) by mouth every 8 (eight) hours as needed., Disp: 60 tablet, Rfl: 0   levothyroxine (SYNTHROID) 200 MCG tablet, Take 200 mcg by mouth daily before breakfast., Disp: , Rfl:    montelukast  (SINGULAIR ) 10 MG tablet, TAKE 1 TABLET(10 MG) BY MOUTH AT BEDTIME, Disp: 90 tablet, Rfl: 2   ondansetron  (ZOFRAN -ODT) 4 MG disintegrating tablet, Take 4 mg by mouth every 6 (six) hours as needed., Disp: , Rfl:    Rimegepant Sulfate (NURTEC) 75 MG TBDP, Take 1 tablet (75 mg total) by mouth as needed (Migraine)., Disp: 10 tablet, Rfl: 5   sertraline  (ZOLOFT ) 50 MG tablet, Take 1 tablet (50 mg total) by mouth daily., Disp: 90 tablet, Rfl: 2   sulfamethoxazole-trimethoprim (BACTRIM DS) 800-160 MG tablet, Take 1 tablet by mouth 2 (two) times daily., Disp: 14 tablet, Rfl: 0  Observations/Objective: Patient is well-developed, well-nourished in no acute distress.  Resting comfortably  at home.  Head is normocephalic, atraumatic.  No labored breathing.  Speech is clear and coherent with logical content.  Patient is alert and oriented at baseline.   Assessment and Plan: 1. Moderate persistent asthma with exacerbation (Primary) - promethazine -dextromethorphan (PROMETHAZINE -DM) 6.25-15 MG/5ML syrup; Take 5 mLs by mouth 4 (four) times daily as  needed for cough.  Dispense: 118 mL; Refill: 0 - predniSONE  (DELTASONE ) 20 MG tablet; Take 2 tablets (40 mg total) by mouth daily with breakfast.  Dispense: 10 tablet; Refill: 0  Supportive measures and OTC medications reviewed.  Will add on 5-day burst of prednisone  along with Promethazine  DM cough syrup.  She is to seek an in person evaluation for any nonresolving, new or worsening symptoms despite treatment.  Follow Up Instructions: I discussed the assessment and treatment plan with the patient. The patient was provided an opportunity to ask questions and all were answered. The patient agreed with the plan and demonstrated an understanding of the instructions.  A copy of instructions were sent to the patient via MyChart unless otherwise noted below.   The patient was advised to call back or seek an in-person evaluation if the symptoms worsen or if the condition fails to improve as anticipated.    Anne Velma Lunger, PA-C

## 2023-12-14 ENCOUNTER — Ambulatory Visit (HOSPITAL_BASED_OUTPATIENT_CLINIC_OR_DEPARTMENT_OTHER): Admitting: Family Medicine

## 2023-12-16 ENCOUNTER — Other Ambulatory Visit (HOSPITAL_BASED_OUTPATIENT_CLINIC_OR_DEPARTMENT_OTHER): Payer: Self-pay | Admitting: Family Medicine

## 2023-12-16 ENCOUNTER — Encounter (HOSPITAL_BASED_OUTPATIENT_CLINIC_OR_DEPARTMENT_OTHER): Payer: Self-pay | Admitting: Family Medicine

## 2023-12-16 DIAGNOSIS — J208 Acute bronchitis due to other specified organisms: Secondary | ICD-10-CM

## 2023-12-16 MED ORDER — ALBUTEROL SULFATE HFA 108 (90 BASE) MCG/ACT IN AERS: 2.0000 | INHALATION_SPRAY | Freq: Four times a day (QID) | RESPIRATORY_TRACT | 3 refills | Status: DC | PRN

## 2023-12-16 MED ORDER — ALBUTEROL SULFATE (2.5 MG/3ML) 0.083% IN NEBU: 2.5000 mg | INHALATION_SOLUTION | Freq: Four times a day (QID) | RESPIRATORY_TRACT | 1 refills | Status: AC | PRN

## 2024-01-12 ENCOUNTER — Ambulatory Visit (INDEPENDENT_AMBULATORY_CARE_PROVIDER_SITE_OTHER): Admitting: Family Medicine

## 2024-01-12 ENCOUNTER — Encounter (HOSPITAL_BASED_OUTPATIENT_CLINIC_OR_DEPARTMENT_OTHER): Payer: Self-pay | Admitting: Family Medicine

## 2024-01-12 VITALS — BP 136/86 | HR 87 | Ht 65.0 in | Wt 362.6 lb

## 2024-01-12 DIAGNOSIS — F419 Anxiety disorder, unspecified: Secondary | ICD-10-CM | POA: Diagnosis not present

## 2024-01-12 DIAGNOSIS — F331 Major depressive disorder, recurrent, moderate: Secondary | ICD-10-CM | POA: Diagnosis not present

## 2024-01-12 DIAGNOSIS — R5382 Chronic fatigue, unspecified: Secondary | ICD-10-CM

## 2024-01-12 DIAGNOSIS — J4540 Moderate persistent asthma, uncomplicated: Secondary | ICD-10-CM | POA: Diagnosis not present

## 2024-01-12 MED ORDER — SERTRALINE HCL 100 MG PO TABS: 100.0000 mg | ORAL_TABLET | Freq: Every day | ORAL | 5 refills | Status: AC

## 2024-01-12 MED ORDER — MONTELUKAST SODIUM 10 MG PO TABS: ORAL_TABLET | ORAL | 3 refills | Status: AC

## 2024-01-12 MED ORDER — BUDESONIDE-FORMOTEROL FUMARATE 80-4.5 MCG/ACT IN AERO: 2.0000 | INHALATION_SPRAY | Freq: Two times a day (BID) | RESPIRATORY_TRACT | 3 refills | Status: AC

## 2024-01-12 MED ORDER — AIRSUPRA 90-80 MCG/ACT IN AERO: 2.0000 | INHALATION_SPRAY | RESPIRATORY_TRACT | 3 refills | Status: AC | PRN

## 2024-01-12 NOTE — Progress Notes (Signed)
 Subjective:   Anne Shaffer May 07, 1985 01/12/2024  Chief Complaint  Patient presents with   Medical Management of Chronic Issues    Pt states she has had worsening fatigue over the last 2-3 weeks and also has been having more problems with her asthma.    Discussed the use of AI scribe software for clinical note transcription with the patient, who gave verbal consent to proceed.  History of Present Illness Anne Shaffer is a 38 year old female with history papillary thyroid  cancer currently managed by [facility] endocrinology who presents with fatigue and insomnia.  She has been experiencing significant fatigue and insomnia over the past few weeks, feeling extremely tired and noting changes in her appetite, often not eating until late in the evening. She reports an increase in depressive symptoms and has a history of anxiety and depression, for which she takes Zoloft  50mg . She feels the current dosage of Zoloft  may be insufficient.  She underwent two thyroidectomies last year and this year, followed by radioactive iodine treatment in June for papillary thyroid  cancer. Post-treatment, she experienced a disruption in her menstrual cycle, missing a month, followed by a heavy cycle that required telehealth consultation and ibuprofen  800 mg for management. Her recent cycle was surprisingly light, similar to those she had in high school. She has not experienced heavy bleeding since June.   PREDIABETES:  She is concerned about her A1c level, which was around 6. She has been attempting dietary changes to manage this. She mentions a previous attempt to start a medication for weight management (Zepbound), which was not covered by her insurance.  MODERATE ASTHMA:  She has a history of asthma, which worsens during seasonal changes. She has run out of her rescue inhaler and has been using a nebulizer. She previously used Trelegy, which was helpful, and is currently without a maintenance  inhaler. She also takes montelukast  at night but has run out.   FATIGUE:  She has experienced significant personal stress, including the loss of two grandparents in the past two years, contributing to her current emotional state. She works from home and finds her fatigue impacts her ability to work effectively, sometimes feeling like she might pass out from tiredness. She reports sleeping 5-6 hours per night and sometimes sleeping all day on weekends when not caring for her brother's children.  She has a history of snoring, which has improved with weight loss, and occasionally wakes with headaches. She has not been evaluated for sleep apnea. She also reports feeling always hot and has a history of trauma related to a thyroid  mass pressing against her trachea, affecting her sleep position.     The following portions of the patient's history were reviewed and updated as appropriate: past medical history, past surgical history, family history, social history, allergies, medications, and problem list.   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/15/2023   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   Migraine without aura and without status migrainosus, not intractable 11/01/2020   Encounter to establish care 05/31/2020   Moderate persistent asthma 05/31/2020   Post-COVID syndrome 05/31/2020   Essential hypertension    Anxiety 03/18/2018   Class 3 severe obesity due to excess calories without serious comorbidity with body mass index (BMI) of 50.0  to 59.9 in adult [facility]) 03/18/2018   THYROID  NODULE 12/03/2008   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   Qualifier: Diagnosis of  By: Lelon RIGGERS, Scott      Essential hypertension    Migraines    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   Salivary calculus    Salivary calculus    Severe obesity (BMI >= 40) (HCC) 12/02/2012   Vitamin D  deficiency    Past Surgical History:  Procedure Laterality Date   THYROIDECTOMY  12/04/2022   THYROIDECTOMY  02/19/2023   Family History  Problem Relation Age of Onset   Thyroid  disease Maternal Aunt    Hyperlipidemia Maternal Grandmother    Diabetes Maternal Grandmother    Hypertension Maternal Grandmother    Cancer Maternal Grandfather        prostate   Hyperlipidemia Maternal Grandfather    Early death Neg Hx    Stroke Neg Hx    Outpatient Medications Prior to Visit  Medication Sig Dispense Refill   albuterol  (PROVENTIL ) (2.5 MG/3ML) 0.083% nebulizer solution Take 3 mLs (2.5 mg total) by nebulization every 6 (six) hours as needed for wheezing or shortness of breath. 150 mL 1   amLODipine  (NORVASC ) 5 MG tablet TAKE 1 TABLET(5 MG) BY MOUTH DAILY 90 tablet 2   fluticasone  (FLONASE ) 50 MCG/ACT nasal spray Place 2 sprays into both nostrils daily. 16 g 0   hydrochlorothiazide  (HYDRODIURIL ) 25 MG tablet Take 1 tablet (25 mg total) by mouth daily. 90 tablet 2   ibuprofen  (ADVIL ) 800 MG tablet Take 1 tablet (800 mg total) by mouth every 8 (eight) hours as needed. 60 tablet 0   levothyroxine (SYNTHROID) 200 MCG tablet Take 200 mcg by mouth daily before breakfast.     ondansetron  (ZOFRAN -ODT) 4 MG disintegrating tablet Take 4 mg by mouth every 6 (six) hours as needed.     Rimegepant Sulfate (NURTEC) 75 MG TBDP Take 1 tablet (75 mg total) by mouth as needed (Migraine). 10 tablet 5   albuterol  (VENTOLIN  HFA) 108 (90 Base) MCG/ACT inhaler Inhale 2 puffs into the lungs every 6 (six) hours as needed for wheezing or shortness of breath. 8 g 3   sertraline  (ZOLOFT ) 50 MG tablet Take 1 tablet (50 mg total) by mouth daily. 90 tablet 2   montelukast  (SINGULAIR ) 10 MG tablet TAKE 1 TABLET(10 MG) BY MOUTH AT BEDTIME  (Patient not taking: Reported on 01/12/2024) 90 tablet 2   predniSONE  (DELTASONE ) 20 MG tablet Take 2 tablets (40 mg total) by mouth daily with breakfast. 10 tablet 0   promethazine -dextromethorphan (PROMETHAZINE -DM) 6.25-15 MG/5ML syrup Take 5 mLs by mouth 4 (four) times daily as needed for cough. 118 mL 0   sulfamethoxazole -trimethoprim  (BACTRIM  DS) 800-160 MG tablet Take 1 tablet by mouth 2 (two) times daily. 14 tablet 0   No facility-administered medications prior to visit.   Allergies  Allergen Reactions   Penicillins Itching   Shellfish Allergy    Tomato Anxiety, Hives, Itching and Swelling   Shellfish Allergy      ROS: A complete ROS was performed with pertinent positives/negatives noted in the HPI. The remainder of the ROS are negative.    Objective:   Today's Vitals   01/12/24 1331 01/12/24 1404  BP: (!) 142/101 136/86  Pulse: 87   SpO2: 99%   Weight: (!) 362 lb 9.6 oz (164.5 kg)   Height: 5' 5 (1.651  m)     Physical Exam   GENERAL: Well-appearing, in NAD. Obese.   SKIN: Pink, warm and dry.  Head: Normocephalic. NECK: Trachea midline. Full ROM w/o pain or tenderness.  RESPIRATORY: Chest wall symmetrical. Respirations even and non-labored. Breath sounds clear to auscultation bilaterally.  CARDIAC: S1, S2 present, regular rate and rhythm without murmur or gallops. Peripheral pulses 2+ bilaterally.  MSK: Muscle tone and strength appropriate for age.  NEUROLOGIC: No motor or sensory deficits. Steady, even gait. C2-C12 intact.  PSYCH/MENTAL STATUS: Alert, oriented x 3. Cooperative, appropriate mood and affect.     Assessment & Plan:  1. Chronic fatigue (Primary) Concern for possible OSA given snoring, daytime somnolence and morning headaches w/ hx of HTN and obesity. Will refer for evaluation. Will check TSH given hx thyroidectomy. Compliant with medication currently.  - Ambulatory referral to Pulmonology - TSH  2. Moderate persistent asthma, unspecified whether  complicated Uncontrolled. Will restart her maintenance inhaler with Symbicort and Montelukast . Will trial Airsupra for rescue. Follow up is scheduled for AE in January 2026.  - Albuterol -Budesonide (AIRSUPRA) 90-80 MCG/ACT AERO; Inhale 2 puffs into the lungs as needed (for shortness of breath or wheezing). ; maximum daily dose 12 inhalations/day.  Dispense: 10.7 g; Refill: 3 - montelukast  (SINGULAIR ) 10 MG tablet; TAKE 1 TABLET(10 MG) BY MOUTH AT BEDTIME  Dispense: 90 tablet; Refill: 3 - budesonide-formoterol (SYMBICORT) 80-4.5 MCG/ACT inhaler; Inhale 2 puffs into the lungs 2 (two) times daily.  Dispense: 1 each; Refill: 3  3. Moderate episode of recurrent major depressive disorder (HCC) 4. Anxiety Uncontrolled. Will increase Sertraline  to 100mg  daily. Safety plan reviewed with patient and CBT recommended. Will follow up in 1 month at physical or sooner if needed.  - sertraline  (ZOLOFT ) 100 MG tablet; Take 1 tablet (100 mg total) by mouth daily.  Dispense: 30 tablet; Refill: 5   Meds ordered this encounter  Medications   Albuterol -Budesonide (AIRSUPRA) 90-80 MCG/ACT AERO    Sig: Inhale 2 puffs into the lungs as needed (for shortness of breath or wheezing). ; maximum daily dose 12 inhalations/day.    Dispense:  10.7 g    Refill:  3    Supervising Provider:   DE CUBA, RAYMOND J [[id number]]   montelukast  (SINGULAIR ) 10 MG tablet    Sig: TAKE 1 TABLET(10 MG) BY MOUTH AT BEDTIME    Dispense:  90 tablet    Refill:  3    Supervising Provider:   DE CUBA, RAYMOND J [[id number]]   budesonide-formoterol (SYMBICORT) 80-4.5 MCG/ACT inhaler    Sig: Inhale 2 puffs into the lungs 2 (two) times daily.    Dispense:  1 each    Refill:  3    Supervising Provider:   DE CUBA, RAYMOND J [[id number]]   sertraline  (ZOLOFT ) 100 MG tablet    Sig: Take 1 tablet (100 mg total) by mouth daily.    Dispense:  30 tablet    Refill:  5    Supervising Provider:   DE CUBA, RAYMOND J [[id number]]   Lab Orders         TSH       Return for As Scheduled .    Patient to reach out to office if new, worrisome, or unresolved symptoms arise or if no improvement in patient's condition. Patient verbalized understanding and is agreeable to treatment plan. All questions answered to patient's satisfaction.    Thersia Schuyler Stark, [place]

## 2024-01-13 ENCOUNTER — Encounter (HOSPITAL_BASED_OUTPATIENT_CLINIC_OR_DEPARTMENT_OTHER): Payer: Self-pay | Admitting: Family Medicine

## 2024-01-13 LAB — TSH: TSH: 0.153 u[IU]/mL — ABNORMAL LOW (ref 0.450–4.500)

## 2024-01-14 ENCOUNTER — Ambulatory Visit (HOSPITAL_BASED_OUTPATIENT_CLINIC_OR_DEPARTMENT_OTHER): Payer: Self-pay | Admitting: Family Medicine

## 2024-01-14 NOTE — Progress Notes (Signed)
 Hi Anne Shaffer,  Your TSH is low indicating an increase amount of T3, T4 hormone available. At this time, we can recheck this in a few weeks. You can let your endocrinologist know as well and they can follow this whichever you would prefer.

## 2024-01-17 DIAGNOSIS — Z0279 Encounter for issue of other medical certificate: Secondary | ICD-10-CM

## 2024-02-21 ENCOUNTER — Ambulatory Visit (INDEPENDENT_AMBULATORY_CARE_PROVIDER_SITE_OTHER): Admitting: Family Medicine

## 2024-02-21 VITALS — BP 146/86 | HR 82 | Temp 98.2°F | Resp 18 | Ht 65.0 in | Wt 362.0 lb

## 2024-02-21 DIAGNOSIS — Z13220 Encounter for screening for lipoid disorders: Secondary | ICD-10-CM

## 2024-02-21 DIAGNOSIS — I10 Essential (primary) hypertension: Secondary | ICD-10-CM

## 2024-02-21 DIAGNOSIS — F419 Anxiety disorder, unspecified: Secondary | ICD-10-CM

## 2024-02-21 DIAGNOSIS — N926 Irregular menstruation, unspecified: Secondary | ICD-10-CM | POA: Insufficient documentation

## 2024-02-21 DIAGNOSIS — F32A Depression, unspecified: Secondary | ICD-10-CM | POA: Diagnosis not present

## 2024-02-21 DIAGNOSIS — J4540 Moderate persistent asthma, uncomplicated: Secondary | ICD-10-CM

## 2024-02-21 DIAGNOSIS — Z0000 Encounter for general adult medical examination without abnormal findings: Secondary | ICD-10-CM

## 2024-02-21 DIAGNOSIS — C73 Malignant neoplasm of thyroid gland: Secondary | ICD-10-CM

## 2024-02-21 DIAGNOSIS — R7303 Prediabetes: Secondary | ICD-10-CM | POA: Insufficient documentation

## 2024-02-21 MED ORDER — HYDROCHLOROTHIAZIDE 25 MG PO TABS: 25.0000 mg | ORAL_TABLET | Freq: Every day | ORAL | 2 refills | Status: AC

## 2024-02-21 MED ORDER — AMLODIPINE BESYLATE 5 MG PO TABS: ORAL_TABLET | ORAL | 2 refills | Status: AC

## 2024-02-21 NOTE — Progress Notes (Signed)
 "  Subjective:   Anne Shaffer September 01, 1985  02/21/2024   CC: Chief Complaint  Patient presents with   Annual Exam    HPI: Anne Shaffer is a 39 y.o. female who presents for a routine health maintenance exam.  Labs collected at time of visit.    Irregular Periods: Patient states she has been having irregular periods and intermittent spotting. LMP October 27th. She states she had spotting the 2nd week of December. 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 Managed by [facility] End[facility]inology with upcoming appt in March.  Previous TSH was low and was recommended to discuss medication titration with Endocrinology.  Desiring pregnancy testing.   Lab Results  Component Value Date   TSH 0.153 (L) 01/12/2024     DEPRESSION: Anne Shaffer presents for the medical management of depression. Patient was titrated Sertraline  to 100mg  daily at last visit for concerns of anxiety and depression. Reports significant improvement with medication therapy  Current medication regimen: Sertraline  100mg  daily  Counseling: recommended Well controlled: Yes per patient  Denies SI/HI.     02/21/2024    2:13 PM 07/15/2023    2:47 PM 04/09/2023   12:13 PM 11/29/2020    3:30 PM 05/31/2020    2:45 PM  Depression screen PHQ 2/9  Decreased Interest 1 0 1 1 2   Down, Depressed, Hopeless 1 1 1  0 2  PHQ - 2 Score 2 1 2 1 4   Altered sleeping 2 0 3 0 3  Tired, decreased energy 3 3 3 1 2   Change in appetite 2 1 1 1 1   Feeling bad or failure about yourself  0 0 1 0 0  Trouble concentrating 0 1 3 0 0  Moving slowly or fidgety/restless 0 0 0 0 0  Suicidal thoughts 0 0 0 0 0  PHQ-9 Score 9 6  13  3  10    Difficult doing work/chores Somewhat difficult Not difficult at all Somewhat difficult Somewhat difficult Somewhat difficult     Data saved with a previous flowsheet row definition      02/21/2024    2:13 PM 07/15/2023     2:48 PM 04/09/2023   12:14 PM 11/29/2020    3:32 PM  GAD 7 : Generalized Anxiety Score  Nervous, Anxious, on Edge 2  1  3   0   Control/stop worrying 2  0  2  0   Worry too much - different things 2  1  2  1    Trouble relaxing 2  1  1  1    Restless 2  0  1  0   Easily annoyed or irritable 2  0  3  1   Afraid - awful might happen 0  0  2  0   Total GAD 7 Score 12 3 14 3   Anxiety Difficulty Somewhat difficult Not difficult at all Somewhat difficult Not difficult at all     Data saved with a previous flowsheet row definition     ASTHMA: Anne Shaffer presents for the medical management of asthma.  Medication regimen: Symbicort  BID, Montelukast  10mg  nightly. Airsupra  PRN Well controlled: Patient reports improvement with restarting Symbicort , montelukast  and starting Airsupra  for moderate asthma. She states she is using Airsupra  intermittently and has significant control of symptoms of SHOB from prior appt.  Denies worsening SHOB, cough, wheezing   HEALTH SCREENINGS: - Vision Screening: Recommended -  Dental Visits: Recommended - Pap smear: Would like referral to OBGYN; referral placed  - Breast Exam: Declined - STD Screening: Declined - Mammogram (40+): Not applicable  - Colonoscopy (45+): Not applicable  - Bone Density (65+ or under 65 with predisposing conditions): Not applicable  - Lung CA screening with low-dose CT:  Not applicable Adults age 87-80 who are current cigarette smokers or quit within the last 15 years. Must have 20 pack year history.   Depression and Anxiety Screen done today and results listed below:     02/21/2024    2:13 PM 07/15/2023    2:47 PM 04/09/2023   12:13 PM 11/29/2020    3:30 PM 05/31/2020    2:45 PM  Depression screen PHQ 2/9  Decreased Interest 1 0 1 1 2   Down, Depressed, Hopeless 1 1 1  0 2  PHQ - 2 Score 2 1 2 1 4   Altered sleeping 2 0 3 0 3  Tired, decreased energy 3 3 3 1 2   Change in appetite 2 1 1 1 1   Feeling bad or failure about yourself   0 0 1 0 0  Trouble concentrating 0 1 3 0 0  Moving slowly or fidgety/restless 0 0 0 0 0  Suicidal thoughts 0 0 0 0 0  PHQ-9 Score 9 6  13  3  10    Difficult doing work/chores Somewhat difficult Not difficult at all Somewhat difficult Somewhat difficult Somewhat difficult     Data saved with a previous flowsheet row definition      02/21/2024    2:13 PM 07/15/2023    2:48 PM 04/09/2023   12:14 PM 11/29/2020    3:32 PM  GAD 7 : Generalized Anxiety Score  Nervous, Anxious, on Edge 2  1  3   0   Control/stop worrying 2  0  2  0   Worry too much - different things 2  1  2  1    Trouble relaxing 2  1  1  1    Restless 2  0  1  0   Easily annoyed or irritable 2  0  3  1   Afraid - awful might happen 0  0  2  0   Total GAD 7 Score 12 3 14 3   Anxiety Difficulty Somewhat difficult Not difficult at all Somewhat difficult Not difficult at all     Data saved with a previous flowsheet row definition    IMMUNIZATIONS: - Tdap: Tetanus vaccination status reviewed: Declined. - HPV: Up to date - Influenza: Refused - Pneumo Vaccines: N/a - Shingrix (50+): Not applicable   Past medical history, surgical history, medications, allergies, family history and social history reviewed with patient today and changes made to appropriate areas of the chart.   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   Qualifier: Diagnosis of  By: Lelon RIGGERS, Scott     Essential hypertension    Migraines    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   Salivary calculus    Salivary calculus    Severe obesity (BMI >= 40) (HCC) 12/02/2012   Vitamin D  deficiency     Past Surgical History:  Procedure Laterality Date   THYROIDECTOMY  12/04/2022   THYROIDECTOMY  02/19/2023    Current Outpatient Medications on File Prior to Visit  Medication Sig   albuterol  (PROVENTIL ) (2.5 MG/3ML) 0.083% nebulizer solution Take 3 mLs (2.5  mg total) by  nebulization every 6 (six) hours as needed for wheezing or shortness of breath.   Albuterol -Budesonide  (AIRSUPRA ) 90-80 MCG/ACT AERO Inhale 2 puffs into the lungs as needed (for shortness of breath or wheezing). ; maximum daily dose 12 inhalations/day.   budesonide -formoterol  (SYMBICORT ) 80-4.5 MCG/ACT inhaler Inhale 2 puffs into the lungs 2 (two) times daily.   fluticasone  (FLONASE ) 50 MCG/ACT nasal spray Place 2 sprays into both nostrils daily.   ibuprofen  (ADVIL ) 800 MG tablet Take 1 tablet (800 mg total) by mouth every 8 (eight) hours as needed.   levothyroxine (SYNTHROID) 200 MCG tablet Take 200 mcg by mouth daily before breakfast.   montelukast  (SINGULAIR ) 10 MG tablet TAKE 1 TABLET(10 MG) BY MOUTH AT BEDTIME   ondansetron  (ZOFRAN -ODT) 4 MG disintegrating tablet Take 4 mg by mouth every 6 (six) hours as needed.   Rimegepant Sulfate (NURTEC) 75 MG TBDP Take 1 tablet (75 mg total) by mouth as needed (Migraine).   sertraline  (ZOLOFT ) 100 MG tablet Take 1 tablet (100 mg total) by mouth daily.   No current facility-administered medications on file prior to visit.    Allergies[1]   Social History   Socioeconomic History   Marital status: Married    Spouse name: Not on file   Number of children: Not on file   Years of education: Not on file   Highest education level: Associate degree: academic program  Occupational History   Not on file  Tobacco Use   Smoking status: Former    Passive exposure: Never   Smokeless tobacco: Never  Substance and Sexual Activity   Alcohol use: Yes    Comment: social   Drug use: Never   Sexual activity: Yes    Birth control/protection: None  Other Topics Concern   Not on file  Social History Narrative   ** Merged History Encounter **       Social Drivers of Health   Tobacco Use: Medium Risk (02/21/2024)   Patient History    Smoking Tobacco Use: Former    Smokeless Tobacco Use: Never    Passive Exposure: Never  Physicist, Medical Strain: Low  Risk (02/21/2024)   Overall Financial Resource Strain (CARDIA)    Difficulty of Paying Living Expenses: Not very hard  Food Insecurity: Food Insecurity Present (02/21/2024)   Epic    Worried About Programme Researcher, Broadcasting/film/video in the Last Year: Sometimes true    Ran Out of Food in the Last Year: Never true  Transportation Needs: No Transportation Needs (02/21/2024)   Epic    Lack of Transportation (Medical): No    Lack of Transportation (Non-Medical): No  Physical Activity: Inactive (02/21/2024)   Exercise Vital Sign    Days of Exercise per Week: 0 days    Minutes of Exercise per Session: Not on file  Stress: Stress Concern Present (02/21/2024)   [employer] of Occupational Health - Occupational Stress Questionnaire    Feeling of Stress: Rather much  Social Connections: Moderately Integrated (02/21/2024)   Social Connection and Isolation Panel    Frequency of Communication with Friends and Family: More than three times a week    Frequency of Social Gatherings with Friends and Family: Three times a week    Attends Religious Services: More than 4 times per year    Active Member of Clubs or Organizations: No    Attends Banker Meetings: Not on file    Marital Status: Married  Intimate Partner Violence: Unknown (05/08/2021)   Received from  [facility]   HITS    Physically Hurt: Not on file    Insult or Talk Down To: Not on file    Threaten Physical Harm: Not on file    Scream or Curse: Not on file  Depression (PHQ2-9): Medium Risk (02/21/2024)   Depression (PHQ2-9)    PHQ-2 Score: 9  Alcohol Screen: Low Risk (02/21/2024)   Alcohol Screen    Last Alcohol Screening Score (AUDIT): 1  Housing: High Risk (02/21/2024)   Epic    Unable to Pay for Housing in the Last Year: Yes    Number of Times Moved in the Last Year: Not on file    Homeless in the Last Year: No  Utilities: Low Risk (10/18/2023)   Received from [facility]   Utilities    In the past 12 months has the electric,  gas, oil, or water company threatened to shut off services in your home? : No  Health Literacy: Not on file   Tobacco Use History[2] Social History   Substance and Sexual Activity  Alcohol Use Yes   Comment: social    Family History  Problem Relation Age of Onset   Thyroid  disease Maternal Aunt    Hyperlipidemia Maternal Grandmother    Diabetes Maternal Grandmother    Hypertension Maternal Grandmother    Cancer Maternal Grandfather        prostate   Hyperlipidemia Maternal Grandfather    Early death Neg Hx    Stroke Neg Hx      ROS: Denies fever, fatigue, unexplained weight loss/gain, chest pain, SHOB, and palpitations. Denies neurological deficits, gastrointestinal or genitourinary complaints, and skin changes.   Objective:   Today's Vitals   02/21/24 1327 02/21/24 1412  BP: (!) 155/99 (!) 146/86  Pulse: 92 82  Resp: 18   Temp: 98.2 F (36.8 C)   TempSrc: Oral   SpO2: 98%   Weight: (!) 362 lb (164.2 kg)   Height: 5' 5 (1.651 m)   PainSc: 0-No pain     GENERAL APPEARANCE: Well-appearing, in NAD.  SKIN: Pink, warm and dry. Turgor normal. No rash, lesion, ulceration, or ecchymoses. Hair evenly distributed.  HEENT: HEAD: Normocephalic.  EYES: PERRLA. EOMI. Lids intact w/o defect. Sclera white, Conjunctiva pink w/o exudate.  EARS: External ear w/o redness, swelling, masses or lesions. EAC clear. TM's intact, translucent w/o bulging, appropriate landmarks visualized. Appropriate acuity to conversational tones.  NOSE: Septum midline w/o deformity. Nares patent, mucosa pink and non-inflamed w/o drainage. No sinus tenderness.  THROAT: Uvula midline. Oropharynx clear. Tonsils non-inflamed w/o exudate. Oral mucosa pink and moist.  NECK: Supple, Trachea midline. Full ROM w/o pain or tenderness. No lymphadenopathy. Thyroid  non-tender w/o enlargement or palpable masses.  RESPIRATORY: Chest wall symmetrical w/o masses. Respirations even and non-labored. Breath sounds clear to  auscultation bilaterally. No wheezes, rales, rhonchi, or crackles. CARDIAC: S1, S2 present, regular rate and rhythm. No gallops, murmurs, rubs, or clicks. PMI w/o lifts, heaves, or thrills. No carotid bruits. Capillary refill <2 seconds. Peripheral pulses 2+ bilaterally. GI: Abdomen soft w/o distention. Normoactive bowel sounds. No palpable masses or tenderness. No guarding or rebound tenderness. Liver and spleen w/o tenderness or enlargement. No CVA tenderness.  MSK: Muscle tone and strength appropriate for age, w/o atrophy or abnormal movement.  EXTREMITIES: Active ROM intact, w/o tenderness, crepitus, or contracture. No obvious joint deformities or effusions. No clubbing, edema, or cyanosis.  NEUROLOGIC: CN's II-XII intact. Motor strength symmetrical with no obvious weakness. No sensory deficits. DTR's 2+  symmetric bilaterally. Steady, even gait.  PSYCH/MENTAL STATUS: Alert, oriented x 3. Cooperative, appropriate mood and affect.    Assessment & Plan:   1. Annual physical exam (Primary) Discussed preventative screenings, vaccines, and healthy lifestyle with patient. Referral placed per patent request to establish with OBGYN. Labs completed.  - Ambulatory referral to Obstetrics / Gynecology - CBC with Differential/Platelet - Comprehensive metabolic panel with GFR  2. Essential hypertension Patient reports running out of BP medication for at least 1-2 weeks. Will refill. BP well controlled with current regimen and promoted adherence. Will continue to monitor BP regularly at home.  - amLODipine  (NORVASC ) 5 MG tablet; TAKE 1 TABLET(5 MG) BY MOUTH DAILY  Dispense: 90 tablet; Refill: 2 - hydrochlorothiazide  (HYDRODIURIL ) 25 MG tablet; Take 1 tablet (25 mg total) by mouth daily.  Dispense: 90 tablet; Refill: 2  3. Irregular menses Will obtain beta Hcg per patient preference instead of urine testing. Will obtain lab evaluation and US   Pelvis/Transvaginal to rule out possible anatomic abnormalities  contributing to irregular menses.  - Beta HCG, Quant - Testosterone  - FSH/LH - Estradiol  - US  PELVIC COMPLETE WITH TRANSVAGINAL; Future  4. Primary thyroid  papillary carcinoma (HCC) TSH low at previous check. May be contributing to patient's irregular menses. Will check with labs today. Continue endocrinology management.  - TSH - T4, free - T3, free  5. Moderate persistent asthma without complication Stable. Continue current regimen.   6. Anxiety and depression Stable and improved. Safety plan reviewed. Continue current regimen.   7. Screening for lipid disorders - Lipid panel  8. Prediabetes Will check A1C with labs today. Discussed dietary changes, recommended exercise and pending labs will consider medication if needed.  - Hemoglobin A1c    Orders Placed This Encounter  Procedures   US  PELVIC COMPLETE WITH TRANSVAGINAL    Standing Status:   Future    Expiration Date:   02/20/2025    Reason for Exam (SYMPTOM  OR DIAGNOSIS REQUIRED):   Irregular menses and spotting    Preferred imaging location?:   [facility]   CBC with Differential/Platelet   Comprehensive metabolic panel with GFR   Lipid panel   TSH   T4, free   T3, free   Hemoglobin A1c   Beta HCG, Quant   Testosterone    FSH/LH   Estradiol    Ambulatory referral to Obstetrics / Gynecology    Referral Priority:   Routine    Referral Type:   Consultation    Referral Reason:   Specialty Services Required    Requested Specialty:   Obstetrics and Gynecology    Number of Visits Requested:   1    PATIENT COUNSELING:  - Encouraged a healthy well-balanced diet. Patient may adjust caloric intake to maintain or achieve ideal body weight. May reduce intake of dietary saturated fat and total fat and have adequate dietary potassium and calcium preferably from fresh fruits, vegetables, and low-fat dairy products.   - Advised to avoid cigarette smoking. - Discussed with the patient that most people either abstain  from alcohol or drink within safe limits (<=14/week and <=4 drinks/occasion for males, <=7/weeks and <= 3 drinks/occasion for females) and that the risk for alcohol disorders and other health effects rises proportionally with the number of drinks per week and how often a drinker exceeds daily limits. - Discussed cessation/primary prevention of drug use and availability of treatment for abuse.  - Discussed sexually transmitted diseases, avoidance of unintended pregnancy and contraceptive alternatives.  - Stressed the importance  of regular exercise - Injury prevention: Discussed safety belts, safety helmets, smoke detector, smoking near bedding or upholstery.  - Dental health: Discussed importance of regular tooth brushing, flossing, and dental visits.   NEXT PREVENTATIVE PHYSICAL DUE IN 1 YEAR.  Return in about 6 months (around 08/20/2024) for follow up HTN, Prediabetes.  Patient to reach out to office if new, worrisome, or unresolved symptoms arise or if no improvement in patient's condition. Patient verbalized understanding and is agreeable to treatment plan. All questions answered to patient's satisfaction.    Anne Stanback Olivia Adel Neyer, FNP      [1]  Allergies Allergen Reactions   Penicillins Itching   Shellfish Allergy    Tomato Anxiety, Hives, Itching and Swelling   Shellfish Allergy   [2]  Social History Tobacco Use  Smoking Status Former   Passive exposure: Never  Smokeless Tobacco Never   "

## 2024-02-22 LAB — CBC WITH DIFFERENTIAL/PLATELET
Basophils Absolute: 0 x10E3/uL (ref 0.0–0.2)
Basos: 1 %
EOS (ABSOLUTE): 0.2 x10E3/uL (ref 0.0–0.4)
Eos: 2 %
Hematocrit: 35 % (ref 34.0–46.6)
Hemoglobin: 9.3 g/dL — ABNORMAL LOW (ref 11.1–15.9)
Immature Grans (Abs): 0 x10E3/uL (ref 0.0–0.1)
Immature Granulocytes: 0 %
Lymphocytes Absolute: 1.7 x10E3/uL (ref 0.7–3.1)
Lymphs: 27 %
MCH: 17.9 pg — ABNORMAL LOW (ref 26.6–33.0)
MCHC: 26.6 g/dL — ABNORMAL LOW (ref 31.5–35.7)
MCV: 67 fL — ABNORMAL LOW (ref 79–97)
Monocytes Absolute: 0.4 x10E3/uL (ref 0.1–0.9)
Monocytes: 6 %
Neutrophils Absolute: 3.9 x10E3/uL (ref 1.4–7.0)
Neutrophils: 64 %
Platelets: 310 x10E3/uL (ref 150–450)
RBC: 5.21 x10E6/uL (ref 3.77–5.28)
RDW: 19.4 % — ABNORMAL HIGH (ref 11.7–15.4)
WBC: 6.2 x10E3/uL (ref 3.4–10.8)

## 2024-02-22 LAB — ESTRADIOL: Estradiol: 106 pg/mL

## 2024-02-22 LAB — TSH: TSH: 0.065 u[IU]/mL — ABNORMAL LOW (ref 0.450–4.500)

## 2024-02-22 LAB — LIPID PANEL
Chol/HDL Ratio: 3.6 ratio (ref 0.0–4.4)
Cholesterol, Total: 139 mg/dL (ref 100–199)
HDL: 39 mg/dL — ABNORMAL LOW
LDL Chol Calc (NIH): 82 mg/dL (ref 0–99)
Triglycerides: 98 mg/dL (ref 0–149)
VLDL Cholesterol Cal: 18 mg/dL (ref 5–40)

## 2024-02-22 LAB — COMPREHENSIVE METABOLIC PANEL WITH GFR
ALT: 12 IU/L (ref 0–32)
AST: 12 IU/L (ref 0–40)
Albumin: 4.1 g/dL (ref 3.9–4.9)
Alkaline Phosphatase: 84 IU/L (ref 41–116)
BUN/Creatinine Ratio: 7 — ABNORMAL LOW (ref 9–23)
BUN: 5 mg/dL — ABNORMAL LOW (ref 6–20)
Bilirubin Total: 0.4 mg/dL (ref 0.0–1.2)
CO2: 22 mmol/L (ref 20–29)
Calcium: 9.6 mg/dL (ref 8.7–10.2)
Chloride: 103 mmol/L (ref 96–106)
Creatinine, Ser: 0.71 mg/dL (ref 0.57–1.00)
Globulin, Total: 3.2 g/dL (ref 1.5–4.5)
Glucose: 106 mg/dL — ABNORMAL HIGH (ref 70–99)
Potassium: 4.8 mmol/L (ref 3.5–5.2)
Sodium: 141 mmol/L (ref 134–144)
Total Protein: 7.3 g/dL (ref 6.0–8.5)
eGFR: 112 mL/min/1.73

## 2024-02-22 LAB — T4, FREE: Free T4: 1.39 ng/dL (ref 0.82–1.77)

## 2024-02-22 LAB — TESTOSTERONE: Testosterone: 31 ng/dL (ref 8–60)

## 2024-02-22 LAB — FSH/LH
FSH: 1.9 m[IU]/mL
LH: 3.2 m[IU]/mL

## 2024-02-22 LAB — T3, FREE: T3, Free: 3.1 pg/mL (ref 2.0–4.4)

## 2024-02-22 LAB — HEMOGLOBIN A1C
Est. average glucose Bld gHb Est-mCnc: 126 mg/dL
Hgb A1c MFr Bld: 6 % — ABNORMAL HIGH (ref 4.8–5.6)

## 2024-02-22 LAB — BETA HCG QUANT (REF LAB): hCG Quant: <1 m[IU]/mL

## 2024-02-23 ENCOUNTER — Ambulatory Visit (HOSPITAL_BASED_OUTPATIENT_CLINIC_OR_DEPARTMENT_OTHER)

## 2024-02-23 NOTE — Addendum Note (Signed)
 Addended by: Tajh Livsey on: 02/23/2024 09:42 AM   Modules accepted: Level of Service

## 2024-02-24 ENCOUNTER — Ambulatory Visit (HOSPITAL_BASED_OUTPATIENT_CLINIC_OR_DEPARTMENT_OTHER): Payer: Self-pay | Admitting: Family Medicine

## 2024-02-24 DIAGNOSIS — R7303 Prediabetes: Secondary | ICD-10-CM

## 2024-02-24 DIAGNOSIS — D508 Other iron deficiency anemias: Secondary | ICD-10-CM

## 2024-02-24 NOTE — Progress Notes (Signed)
 Your hemoglobin and blood cell counts are significantly low and may indicate an iron deficiency.  Do you have heavy menstrual periods and have you had any history of iron deficiency in the past?  I did see several years ago (2010) that you have also had low results, but I am not able to see the notes for this or any history of iron deficiency still present in your chart due to lack of electronic documentation at that time.  Please increase your clear fluid intake as well.  Your A1c did show prediabetes.  Discussing starting medication such as metformin or Zepbound will be helpful with weight loss and improvement of your A1c and blood sugar.  Increasing a heart healthy diet will also help with your HDL.  Other cholesterol values are stable.  Your thyroid  hormone was slightly low still, but improved from prior.  Please relay this to your endocrinologist with [facility] for further titration of your medication.  Please let me know ASAP regarding your history of low blood counts.  This is likely contributing to significant fatigue.

## 2024-02-25 ENCOUNTER — Other Ambulatory Visit (HOSPITAL_BASED_OUTPATIENT_CLINIC_OR_DEPARTMENT_OTHER): Payer: Self-pay | Admitting: Family Medicine

## 2024-02-25 DIAGNOSIS — N921 Excessive and frequent menstruation with irregular cycle: Secondary | ICD-10-CM

## 2024-02-25 MED ORDER — IRON (FERROUS SULFATE) 325 (65 FE) MG PO TABS: 325.0000 mg | ORAL_TABLET | ORAL | 2 refills | Status: AC

## 2024-02-25 MED ORDER — IBUPROFEN 800 MG PO TABS: 800.0000 mg | ORAL_TABLET | Freq: Three times a day (TID) | ORAL | 0 refills | Status: AC | PRN

## 2024-03-01 ENCOUNTER — Other Ambulatory Visit (HOSPITAL_COMMUNITY): Payer: Self-pay

## 2024-03-01 ENCOUNTER — Telehealth (HOSPITAL_BASED_OUTPATIENT_CLINIC_OR_DEPARTMENT_OTHER): Payer: Self-pay | Admitting: Pharmacy Technician

## 2024-03-01 NOTE — Telephone Encounter (Signed)
 Pharmacy Patient Advocate Encounter   Received notification from [facility] KEY that prior authorization for Wegovy 0.25MG /0.5ML auto-injectors is required/requested.   Insurance verification completed.   The patient is insured through [facility].   Per test claim: Per test claim, medication is not covered due to plan/benefit exclusion, PA not submitted at this time

## 2024-03-01 NOTE — Telephone Encounter (Signed)
Routing to Amanda for review.

## 2024-03-01 NOTE — Telephone Encounter (Signed)
 Pharmacy Patient Advocate Encounter   Received notification from [employer] [facility] KEY that prior authorization for Zepbound 5MG /0.5ML pen-injectors is required/requested.   Insurance verification completed.   The patient is insured through [employer].   Per test claim: Per test claim, medication is not covered due to plan/benefit exclusion, PA not submitted at this time

## 2024-03-02 MED ORDER — METFORMIN HCL ER 500 MG PO TB24: 500.0000 mg | ORAL_TABLET | Freq: Every day | ORAL | 3 refills | Status: AC

## 2024-03-02 NOTE — Addendum Note (Signed)
 Addended by: Kiara Keep on: 03/02/2024 04:17 PM   Modules accepted: Orders

## 2024-03-10 ENCOUNTER — Encounter (HOSPITAL_BASED_OUTPATIENT_CLINIC_OR_DEPARTMENT_OTHER): Payer: Self-pay | Admitting: Family Medicine

## 2024-04-03 ENCOUNTER — Ambulatory Visit (HOSPITAL_BASED_OUTPATIENT_CLINIC_OR_DEPARTMENT_OTHER): Admitting: Pulmonary Disease

## 2024-08-21 ENCOUNTER — Ambulatory Visit (HOSPITAL_BASED_OUTPATIENT_CLINIC_OR_DEPARTMENT_OTHER): Admitting: Family Medicine
